# Patient Record
Sex: Female | Born: 1975
Health system: Southern US, Community
[De-identification: ages and names within clinical notes are randomized; demographics above are authoritative.]

## PROBLEM LIST (undated history)

## (undated) DIAGNOSIS — J45909 Unspecified asthma, uncomplicated: Secondary | ICD-10-CM

## (undated) DIAGNOSIS — R51 Headache: Secondary | ICD-10-CM

## (undated) DIAGNOSIS — K219 Gastro-esophageal reflux disease without esophagitis: Secondary | ICD-10-CM

## (undated) DIAGNOSIS — E079 Disorder of thyroid, unspecified: Secondary | ICD-10-CM

## (undated) DIAGNOSIS — D649 Anemia, unspecified: Secondary | ICD-10-CM

## (undated) DIAGNOSIS — Z8739 Personal history of other diseases of the musculoskeletal system and connective tissue: Secondary | ICD-10-CM

## (undated) DIAGNOSIS — K8020 Calculus of gallbladder without cholecystitis without obstruction: Secondary | ICD-10-CM

## (undated) DIAGNOSIS — D699 Hemorrhagic condition, unspecified: Secondary | ICD-10-CM

## (undated) DIAGNOSIS — E119 Type 2 diabetes mellitus without complications: Secondary | ICD-10-CM

## (undated) DIAGNOSIS — I10 Essential (primary) hypertension: Secondary | ICD-10-CM

## (undated) DIAGNOSIS — E669 Obesity, unspecified: Secondary | ICD-10-CM

## (undated) HISTORY — DX: Unspecified asthma, uncomplicated: J45.909

## (undated) HISTORY — DX: Type 2 diabetes mellitus without complications: E11.9

## (undated) HISTORY — DX: Anemia, unspecified: D64.9

## (undated) HISTORY — DX: Gastro-esophageal reflux disease without esophagitis: K21.9

---

## 2012-04-15 ENCOUNTER — Emergency Department (HOSPITAL_COMMUNITY)
Admission: EM | Admit: 2012-04-15 | Discharge: 2012-04-15 | Disposition: A | Discharge status: Home / Self Care | Payer: Self-pay | Attending: Emergency Medicine | Admitting: Emergency Medicine

## 2012-04-15 ENCOUNTER — Encounter (HOSPITAL_COMMUNITY): Payer: Self-pay | Admitting: *Deleted

## 2012-04-15 DIAGNOSIS — S139XXA Sprain of joints and ligaments of unspecified parts of neck, initial encounter: Secondary | ICD-10-CM | POA: Insufficient documentation

## 2012-04-15 DIAGNOSIS — Y929 Unspecified place or not applicable: Secondary | ICD-10-CM | POA: Insufficient documentation

## 2012-04-15 DIAGNOSIS — Z8709 Personal history of other diseases of the respiratory system: Secondary | ICD-10-CM | POA: Insufficient documentation

## 2012-04-15 DIAGNOSIS — X58XXXA Exposure to other specified factors, initial encounter: Secondary | ICD-10-CM | POA: Insufficient documentation

## 2012-04-15 DIAGNOSIS — I10 Essential (primary) hypertension: Secondary | ICD-10-CM | POA: Insufficient documentation

## 2012-04-15 DIAGNOSIS — E079 Disorder of thyroid, unspecified: Secondary | ICD-10-CM | POA: Insufficient documentation

## 2012-04-15 DIAGNOSIS — S161XXA Strain of muscle, fascia and tendon at neck level, initial encounter: Secondary | ICD-10-CM

## 2012-04-15 DIAGNOSIS — Y939 Activity, unspecified: Secondary | ICD-10-CM | POA: Insufficient documentation

## 2012-04-15 HISTORY — DX: Disorder of thyroid, unspecified: E07.9

## 2012-04-15 HISTORY — DX: Essential (primary) hypertension: I10

## 2012-04-15 MED ORDER — IBUPROFEN 800 MG PO TABS: 800.0000 mg | ORAL_TABLET | Freq: Three times a day (TID) | ORAL | Status: DC | PRN

## 2012-04-15 MED ORDER — HYDROCODONE-ACETAMINOPHEN 5-325 MG PO TABS: 1.0000 | ORAL_TABLET | Freq: Four times a day (QID) | ORAL | Status: DC | PRN

## 2012-04-15 NOTE — ED Notes (Signed)
C/O neck pain x 1 month. States Dr. In [facility] told her she had thyroid disease but did not put her on meds.

## 2012-04-15 NOTE — ED Provider Notes (Signed)
Medical screening examination/treatment/procedure(s) were performed by non-physician practitioner and as supervising physician I was immediately available for consultation/collaboration.   Glynn Octave, MD 04/15/12 [phone]

## 2012-04-15 NOTE — ED Notes (Signed)
PT is here for bilateral neck pain with movement and was seen one month ago in [facility] and was given pain medications and now she is out.  Pain hurts with movement

## 2012-04-15 NOTE — ED Provider Notes (Signed)
History     CSN: [id number]  Arrival date & time 04/15/12  1042   First MD Initiated Contact with Patient 04/15/12 1148      Chief Complaint  Patient presents with  . Neck Pain    (Consider location/radiation/quality/duration/timing/severity/associated sxs/prior treatment) HPI Ms. Moustafa is a 37 year old female with cervical neck pain x 1 month. She had similar cervical neck pain 1 year ago. 1 month ago she went to a doctor in [place], and was told she had a muscle spasm and was given some pain medication and muscle relaxant. Her pain was relieved, but has since returned. She states the pain is 8/10, bilateral on the lateral neck, doesn't radiate, and hurts with cervical rotation. She has used heat, which helps, but the pain returns when she removes the heat. She has been taking her flexeril and tramadol, but these have not relieved the pain. She took one of her husband's hydrocodone, which she reports the pain went away and she was able to sleep.  Past Medical History  Diagnosis Date  . Thyroid disease   . Hypertension   . Bronchitis     History reviewed. No pertinent past surgical history.  No family history on file.  History  Substance Use Topics  . Smoking status: Never Smoker   . Smokeless tobacco: Not on file  . Alcohol Use: Yes     Comment: occ    OB History   Grav Para Term Preterm Abortions TAB SAB Ect Mult Living                  Review of Systems All other systems negative except as documented in the HPI. All pertinent positives and negatives as reviewed in the HPI.  Allergies  Review of patient's allergies indicates no known allergies.  Home Medications  No current outpatient prescriptions on file.  BP 164/99  Pulse 94  Temp(Src) 98.1 F (36.7 C) (Oral)  Resp 18  SpO2 97%  Physical Exam  Constitutional: She is oriented to person, place, and time. She appears well-developed and well-nourished.  HENT:  Head: Normocephalic and atraumatic.    Eyes: Pupils are equal, round, and reactive to light.  Neck: Neck supple. Muscular tenderness present. Carotid bruit is not present.    Cardiovascular: Normal rate and regular rhythm.   Pulmonary/Chest: Effort normal and breath sounds normal.  Neurological: She is alert and oriented to person, place, and time. She has normal reflexes. She exhibits normal muscle tone. Coordination normal.  5/5 strength with cervical rotation, flexion and extension.  Skin: Skin is warm and dry. No rash noted.    ED Course  Procedures (including critical care time)  Patient be treated for cervical strain.  Told to return here as needed.  Patient not have any signs or symptoms of neurological deficits or impairment.  Patient has normal reflexes in the upper extremity.  Patient is advised to use ice and heat on her neck.  No acute injury was noted at that time the pain began.  Therefore no x-rays were ordered.   MDM          Carlyle Dolly, PA-C 04/15/12 1228

## 2013-09-01 ENCOUNTER — Ambulatory Visit: Payer: Medicaid Other | Admitting: Obstetrics

## 2013-09-02 ENCOUNTER — Encounter (INDEPENDENT_AMBULATORY_CARE_PROVIDER_SITE_OTHER): Payer: Self-pay | Admitting: Surgery

## 2013-09-02 ENCOUNTER — Ambulatory Visit (INDEPENDENT_AMBULATORY_CARE_PROVIDER_SITE_OTHER): Payer: Medicaid Other | Admitting: Surgery

## 2013-09-02 VITALS — BP 136/88 | HR 74 | Temp 97.4°F | Resp 16 | Ht 62.0 in | Wt 261.4 lb

## 2013-09-02 DIAGNOSIS — K8010 Calculus of gallbladder with chronic cholecystitis without obstruction: Secondary | ICD-10-CM

## 2013-09-02 NOTE — Progress Notes (Signed)
General Surgery [facility]  Chief Complaint  Patient presents with  . New Evaluation    symptomatic cholelithiasis - referral from Dr. Nolene Ebbs    HISTORY: Patient is a 38 year old female referred by her primary care physician for evaluation for cholecystectomy. Patient has a history of cholelithiasis dating back approximately 8 years. She has had numerous moves from location to location which has prevented her from having her gallbladder removed. She has recently been diagnosed with type 2 diabetes.  Patient has right upper quadrant abdominal pain associated with nausea approximately weekly. This appears to be associated with fatty foods. She denies any history of John this or acholic stools. She has had no prior abdominal surgery.  Ultrasound performed 08/05/2013 at [facility] shows multiple gallstones, slightly contracted gallbladder, and a thickened gallbladder wall measuring 4 mm.  Past Medical History  Diagnosis Date  . Thyroid disease   . Hypertension   . Bronchitis   . Anemia   . Asthma   . GERD (gastroesophageal reflux disease)   . Diabetes mellitus without complication     Current Outpatient Prescriptions  Medication Sig Dispense Refill  . albuterol (PROVENTIL HFA;VENTOLIN HFA) 108 (90 BASE) MCG/ACT inhaler Inhale 2 puffs into the lungs every 4 (four) hours as needed for wheezing or shortness of breath.      . Calcium Carbonate Antacid (TUMS E-X PO) Take by mouth.      Marland Kitchen HYDROcodone-acetaminophen (NORCO/VICODIN) 5-325 MG per tablet Take 1 tablet by mouth every 6 (six) hours as needed for pain.  15 tablet  0  . ibuprofen (ADVIL,MOTRIN) 800 MG tablet Take 1 tablet (800 mg total) by mouth every 8 (eight) hours as needed for pain.  21 tablet  0  . IRON, FERROUS GLUCONATE, PO Take by mouth.      Marland Kitchen lisinopril-hydrochlorothiazide (PRINZIDE,ZESTORETIC) 20-12.5 MG per tablet Take 1 tablet by mouth daily.      Marland Kitchen METFORMIN HCL PO Take 500 mg by mouth 2  (two) times daily.       . pantoprazole (PROTONIX) 40 MG tablet Take 40 mg by mouth daily.      . phentermine 37.5 MG capsule Take 37.5 mg by mouth every morning.       No current facility-administered medications for this visit.    No Known Allergies  Family History  Problem Relation Age of Onset  . Cancer Mother     Pancreatic Cancer    History   Social History  . Marital Status: Married    Spouse Name: N/A    Number of Children: N/A  . Years of Education: N/A   Social History Main Topics  . Smoking status: Current Every Day Smoker  . Smokeless tobacco: None  . Alcohol Use: Yes     Comment: occ  . Drug Use: No  . Sexual Activity: None   Other Topics Concern  . None   Social History Narrative  . None    REVIEW OF SYSTEMS - PERTINENT POSITIVES ONLY: Intermittent right upper quadrant abdominal pain. Intermittent nausea. Fatty food intolerance.  EXAM: Filed Vitals:   09/02/13 1538  BP: 136/88  Pulse: 74  Temp: 97.4 F (36.3 C)  Resp: 16    GENERAL: well-developed, well-nourished, no acute distress HEENT: normocephalic; pupils equal and reactive; sclerae clear; dentition good; mucous membranes moist NECK:  No palpable masses in the thyroid bed; symmetric on extension; no palpable anterior or posterior cervical lymphadenopathy; no supraclavicular masses; no tenderness CHEST: clear to  auscultation bilaterally without rales, rhonchi, or wheezes CARDIAC: regular rate and rhythm without significant murmur; peripheral pulses are full ABDOMEN: soft without distension; bowel sounds present; no mass; no hepatosplenomegaly; no hernia EXT:  non-tender without edema; no deformity NEURO: no gross focal deficits; no sign of tremor   LABORATORY RESULTS: See [facility] (CHL-Epic) for most recent results  RADIOLOGY RESULTS: See [facility] (CHL-Epic) for most recent results  IMPRESSION: Symptomatic cholelithiasis, chronic cholecystitis  PLAN: I discussed  the above findings at length with the patient. We discussed laparoscopic cholecystectomy. I provided her with written literature to review at home. Patient would like to proceed with cholecystectomy in the near future.  The risks and benefits of the procedure have been discussed at length with the patient.  The patient understands the proposed procedure, potential alternative treatments, and the course of recovery to be expected.  All of the patient's questions have been answered at this time.  The patient wishes to proceed with surgery.  Earnstine Regal, MD, [facility]  Primary Care Physician: Philis Fendt, MD

## 2013-09-02 NOTE — Patient Instructions (Signed)
  [facility]  LAPAROSCOPIC SURGERY - POST-OP INSTRUCTIONS  Always review your discharge instruction sheet given to you by the facility where your surgery was performed.  A prescription for pain medication may be given to you upon discharge.  Take your pain medication as prescribed.  If narcotic pain medicine is not needed, then you may take acetaminophen (Tylenol) or ibuprofen (Advil) as needed.  Take your usually prescribed medications unless otherwise directed.  If you need a refill on your pain medication, please contact your pharmacy.  They will contact our office to request authorization. Prescriptions will not be filled after 5 P.M. or on weekends.  You should follow a light diet the first few days after arrival home, such as soup and crackers or toast.  Be sure to include plenty of fluids daily.  Most patients will experience some swelling and bruising in the area of the incisions.  Ice packs will help.  Swelling and bruising can take several days to resolve.   It is common to experience some constipation if taking pain medication after surgery.  Increasing fluid intake and taking a stool softener (such as Colace) will usually help or prevent this problem from occurring.  A mild laxative (Milk of Magnesia or Miralax) should be taken according to package instructions if there are no bowel movements after 48 hours.  Unless discharge instructions indicate otherwise, you may remove your bandages 24-48 hours after surgery, and you may shower at that time.  You may have steri-strips (small skin tapes) in place directly over the incision.  These strips should be left on the skin for 7-10 days.  If your surgeon used skin glue on the incision, you may shower in 24 hours.  The glue will flake off over the next 2-3 weeks.  Any sutures or staples will be removed at the office during your follow-up visit.  ACTIVITIES:  You may resume regular (light) daily activities beginning the  next day-such as daily self-care, walking, climbing stairs-gradually increasing activities as tolerated.  You may have sexual intercourse when it is comfortable.  Refrain from any heavy lifting or straining until approved by your doctor.  You may drive when you are no longer taking prescription pain medication, you can comfortably wear a seatbelt, and you can safely maneuver your car and apply brakes.  You should see your doctor in the office for a follow-up appointment approximately 2-3 weeks after your surgery.  Make sure that you call for this appointment within a day or two after you arrive home to insure a convenient appointment time.  WHEN TO CALL YOUR DOCTOR: 1. Fever over 101.0 2. Inability to urinate 3. Continued bleeding from incision 4. Increased pain, redness, or drainage from the incision 5. Increasing abdominal pain  The clinic staff is available to answer your questions during regular business hours.  Please don't hesitate to call and ask to speak to one of the nurses for clinical concerns.  If you have a medical emergency, go to the nearest emergency room or call 911.  A surgeon from [facility] is always on call for the hospital.  Shavaun Osterloh M. Kharon Hixon, MD, FACS [facility] Office: [phone] Toll Free:  [phone] [phone]  Web site: [email or web address] 

## 2013-09-16 ENCOUNTER — Encounter (HOSPITAL_COMMUNITY): Payer: Self-pay | Admitting: Pharmacy Technician

## 2013-09-17 NOTE — Patient Instructions (Addendum)
Crystal Brewer  09/17/2013                           YOUR PROCEDURE IS SCHEDULED ON: 09/26/13               ENTER THRU [facility] MAIN HOSPITAL ENTRANCE AND                            FOLLOW  SIGNS TO SHORT STAY CENTER                 ARRIVE AT SHORT STAY AT: 10:30 AM               CALL THIS NUMBER IF ANY PROBLEMS THE DAY OF SURGERY :               [phone]                                REMEMBER:   Do not eat food or drink liquids AFTER MIDNIGHT            MAY HAVE WATER UNTIL 6:30 AM            STOP ALL ASPIRIN AND HERBAL MEDS 5 DAYS PREOP                  Take these medicines the morning of surgery with               A SIPS OF WATER :    none   Do not wear jewelry, make-up   Do not wear lotions, powders, or perfumes.   Do not shave legs or underarms 12 hrs. before surgery (men may shave face)  Do not bring valuables to the hospital.  Contacts, dentures or bridgework may not be worn into surgery.  Leave suitcase in the car. After surgery it may be brought to your room.  For patients admitted to the hospital more than one night, checkout time is            11:00 AM                                                       The day of discharge.   Patients discharged the day of surgery will not be allowed to drive home.            If going home same day of surgery, must have someone stay with you              FIRST 24 hrs at home and arrange for some one to drive you              home from hospital.   ________________________________________________________________________                                                                        [place]  Before surgery, you can  play an important role.  Because skin is not sterile, your skin needs to be as free of germs as possible.  You can reduce the number of germs on your skin by washing with CHG (chlorahexidine gluconate) soap before surgery.  CHG is an antiseptic cleaner which  kills germs and bonds with the skin to continue killing germs even after washing. Please DO NOT use if you have an allergy to CHG or antibacterial soaps.  If your skin becomes reddened/irritated stop using the CHG and inform your nurse when you arrive at Short Stay. Do not shave (including legs and underarms) for at least 48 hours prior to the first CHG shower.  You may shave your face. Please follow these instructions carefully:   1.  Shower with CHG Soap the night before surgery and the  morning of Surgery.   2.  If you choose to wash your hair, wash your hair first as usual with your  normal  Shampoo.   3.  After you shampoo, rinse your hair and body thoroughly to remove the  shampoo.                                         4.  Use CHG as you would any other liquid soap.  You can apply chg directly  to the skin and wash . Gently wash with scrungie or clean wascloth    5.  Apply the CHG Soap to your body ONLY FROM THE NECK DOWN.   Do not use on open                           Wound or open sores. Avoid contact with eyes, ears mouth and genitals (private parts).                        Genitals (private parts) with your normal soap.              6.  Wash thoroughly, paying special attention to the area where your surgery  will be performed.   7.  Thoroughly rinse your body with warm water from the neck down.   8.  DO NOT shower/wash with your normal soap after using and rinsing off  the CHG Soap .                9.  Pat yourself dry with a clean towel.             10.  Wear clean pajamas.             11.  Place clean sheets on your bed the night of your first shower and do not  sleep with pets.  Day of Surgery : Do not apply any lotions/deodorants the morning of surgery.  Please wear clean clothes to the hospital/surgery center.  FAILURE TO FOLLOW THESE INSTRUCTIONS MAY RESULT IN THE CANCELLATION OF YOUR SURGERY    PATIENT  SIGNATURE_________________________________  ______________________________________________________________________

## 2013-09-18 ENCOUNTER — Ambulatory Visit (HOSPITAL_COMMUNITY)
Admission: RE | Admit: 2013-09-18 | Discharge: 2013-09-18 | Disposition: A | Discharge status: Home / Self Care | Payer: Medicaid Other | Source: Ambulatory Visit | Attending: Anesthesiology | Admitting: Anesthesiology

## 2013-09-18 ENCOUNTER — Encounter (HOSPITAL_COMMUNITY): Payer: Self-pay

## 2013-09-18 ENCOUNTER — Encounter (HOSPITAL_COMMUNITY)
Admission: RE | Admit: 2013-09-18 | Discharge: 2013-09-18 | Disposition: A | Discharge status: Home / Self Care | Payer: Medicaid Other | Source: Ambulatory Visit | Attending: Surgery | Admitting: Surgery

## 2013-09-18 DIAGNOSIS — I10 Essential (primary) hypertension: Secondary | ICD-10-CM | POA: Diagnosis not present

## 2013-09-18 HISTORY — DX: Calculus of gallbladder without cholecystitis without obstruction: K80.20

## 2013-09-18 HISTORY — DX: Headache: R51

## 2013-09-18 HISTORY — DX: Hemorrhagic condition, unspecified: D69.9

## 2013-09-18 LAB — BASIC METABOLIC PANEL
ANION GAP: 12 (ref 5–15)
BUN: 15 mg/dL (ref 6–23)
CALCIUM: 9.7 mg/dL (ref 8.4–10.5)
CHLORIDE: 102 meq/L (ref 96–112)
CO2: 23 meq/L (ref 19–32)
Creatinine, Ser: 0.61 mg/dL (ref 0.50–1.10)
GFR calc non Af Amer: >90 mL/min (ref >90)
Glucose, Bld: 139 mg/dL — ABNORMAL HIGH (ref 70–99)
Potassium: 3.8 mEq/L (ref 3.7–5.3)
SODIUM: 137 meq/L (ref 137–147)

## 2013-09-18 LAB — CBC
HCT: 29.4 % — ABNORMAL LOW (ref 36.0–46.0)
Hemoglobin: 8.7 g/dL — ABNORMAL LOW (ref 12.0–15.0)
MCH: 23.6 pg — AB (ref 26.0–34.0)
MCHC: 29.6 g/dL — ABNORMAL LOW (ref 30.0–36.0)
MCV: 79.9 fL (ref 78.0–100.0)
PLATELETS: 487 10*3/uL — AB (ref 150–400)
RBC: 3.68 MIL/uL — AB (ref 3.87–5.11)
RDW: 17.5 % — AB (ref 11.5–15.5)
WBC: 10.2 10*3/uL (ref 4.0–10.5)

## 2013-09-18 LAB — HCG, SERUM, QUALITATIVE: PREG SERUM: NEGATIVE

## 2013-09-18 NOTE — Progress Notes (Signed)
09/18/13 0825  OBSTRUCTIVE SLEEP APNEA  Have you ever been diagnosed with sleep apnea through a sleep study? No  Do you snore loudly (loud enough to be heard through closed doors)?  1  Do you often feel tired, fatigued, or sleepy during the daytime? 1  Has anyone observed you stop breathing during your sleep? 0  Do you have, or are you being treated for high blood pressure? 1  BMI more than 35 kg/m2? 1  Age over 39 years old? 0  Neck circumference greater than 40 cm/16 inches? 1  Gender: 0  Obstructive Sleep Apnea Score 5  Score 4 or greater  Results sent to PCP

## 2013-09-18 NOTE — Progress Notes (Signed)
Abnormal CBC faxed to Dr, Harlow Asa

## 2013-09-19 NOTE — Progress Notes (Signed)
Quick Note:  These results are acceptable for scheduled surgery.  Sherilee Smotherman M. Marisha Renier, MD, FACS [facility] Office: [phone]   ______ 

## 2013-09-19 NOTE — Progress Notes (Signed)
Quick Note:  Pre-operative chest x-ray is acceptable for scheduled surgery.  Akila Batta M. Benno Brensinger, MD, FACS [facility] Office: [phone]   ______ 

## 2013-09-19 NOTE — Progress Notes (Signed)
Quick Note:  EKG is acceptable for scheduled surgery.  Lars Jeziorski M. Blease Capaldi, MD, FACS [facility] Office: [phone]   ______ 

## 2013-09-26 ENCOUNTER — Encounter (HOSPITAL_COMMUNITY)
Admission: RE | Disposition: A | Discharge status: Home / Self Care | Payer: Self-pay | Source: Ambulatory Visit | Attending: Surgery

## 2013-09-26 ENCOUNTER — Encounter (HOSPITAL_COMMUNITY): Payer: Self-pay | Admitting: *Deleted

## 2013-09-26 ENCOUNTER — Ambulatory Visit (HOSPITAL_COMMUNITY): Payer: Medicaid Other

## 2013-09-26 ENCOUNTER — Telehealth (INDEPENDENT_AMBULATORY_CARE_PROVIDER_SITE_OTHER): Payer: Self-pay

## 2013-09-26 ENCOUNTER — Observation Stay (HOSPITAL_COMMUNITY)
Admission: RE | Admit: 2013-09-26 | Discharge: 2013-09-26 | Disposition: A | Discharge status: Home / Self Care | Payer: Medicaid Other | Source: Ambulatory Visit | Attending: Surgery | Admitting: Surgery

## 2013-09-26 ENCOUNTER — Ambulatory Visit (HOSPITAL_COMMUNITY): Payer: Medicaid Other | Admitting: Anesthesiology

## 2013-09-26 ENCOUNTER — Encounter (HOSPITAL_COMMUNITY): Payer: Medicaid Other | Admitting: Anesthesiology

## 2013-09-26 DIAGNOSIS — J45909 Unspecified asthma, uncomplicated: Secondary | ICD-10-CM | POA: Diagnosis not present

## 2013-09-26 DIAGNOSIS — E079 Disorder of thyroid, unspecified: Secondary | ICD-10-CM | POA: Insufficient documentation

## 2013-09-26 DIAGNOSIS — K219 Gastro-esophageal reflux disease without esophagitis: Secondary | ICD-10-CM | POA: Insufficient documentation

## 2013-09-26 DIAGNOSIS — K8010 Calculus of gallbladder with chronic cholecystitis without obstruction: Secondary | ICD-10-CM | POA: Diagnosis present

## 2013-09-26 DIAGNOSIS — F17200 Nicotine dependence, unspecified, uncomplicated: Secondary | ICD-10-CM | POA: Insufficient documentation

## 2013-09-26 DIAGNOSIS — D649 Anemia, unspecified: Secondary | ICD-10-CM | POA: Diagnosis not present

## 2013-09-26 DIAGNOSIS — E119 Type 2 diabetes mellitus without complications: Secondary | ICD-10-CM | POA: Insufficient documentation

## 2013-09-26 DIAGNOSIS — I10 Essential (primary) hypertension: Secondary | ICD-10-CM | POA: Diagnosis not present

## 2013-09-26 DIAGNOSIS — Z79899 Other long term (current) drug therapy: Secondary | ICD-10-CM | POA: Diagnosis not present

## 2013-09-26 HISTORY — DX: Calculus of gallbladder with chronic cholecystitis without obstruction: K80.10

## 2013-09-26 HISTORY — PX: CHOLECYSTECTOMY: SHX55

## 2013-09-26 LAB — GLUCOSE, CAPILLARY
Glucose-Capillary: 141 mg/dL — ABNORMAL HIGH (ref 70–99)
Glucose-Capillary: 151 mg/dL — ABNORMAL HIGH (ref 70–99)

## 2013-09-26 SURGERY — LAPAROSCOPIC CHOLECYSTECTOMY WITH INTRAOPERATIVE CHOLANGIOGRAM
Anesthesia: General | Site: Abdomen

## 2013-09-26 MED ORDER — HYDROMORPHONE HCL PF 1 MG/ML IJ SOLN: 0.2500 mg | INTRAMUSCULAR | Status: DC | PRN

## 2013-09-26 MED ORDER — HYDROMORPHONE HCL PF 2 MG/ML IJ SOLN
INTRAMUSCULAR | Status: AC
  Filled 2013-09-26: qty 1

## 2013-09-26 MED ORDER — HYDROCHLOROTHIAZIDE 12.5 MG PO CAPS: 12.5000 mg | ORAL_CAPSULE | Freq: Every day | ORAL | Status: DC

## 2013-09-26 MED ORDER — PROPOFOL 10 MG/ML IV BOLUS
INTRAVENOUS | Status: DC | PRN
  Administered 2013-09-26: 170 mg via INTRAVENOUS

## 2013-09-26 MED ORDER — ROCURONIUM BROMIDE 100 MG/10ML IV SOLN
INTRAVENOUS | Status: DC | PRN
  Administered 2013-09-26: 10 mg via INTRAVENOUS
  Administered 2013-09-26: 40 mg via INTRAVENOUS

## 2013-09-26 MED ORDER — LACTATED RINGERS IV SOLN
INTRAVENOUS | Status: DC | PRN
  Administered 2013-09-26: 11:00:00 via INTRAVENOUS

## 2013-09-26 MED ORDER — HYDROCODONE-ACETAMINOPHEN 5-325 MG PO TABS: 1.0000 | ORAL_TABLET | ORAL | Status: DC | PRN

## 2013-09-26 MED ORDER — ALBUTEROL SULFATE (2.5 MG/3ML) 0.083% IN NEBU: 2.5000 mg | INHALATION_SOLUTION | RESPIRATORY_TRACT | Status: DC | PRN

## 2013-09-26 MED ORDER — ONDANSETRON HCL 4 MG/2ML IJ SOLN
INTRAMUSCULAR | Status: DC | PRN
  Administered 2013-09-26: 4 mg via INTRAVENOUS

## 2013-09-26 MED ORDER — LIDOCAINE HCL (CARDIAC) 20 MG/ML IV SOLN
INTRAVENOUS | Status: DC | PRN
  Administered 2013-09-26: 50 mg via INTRAVENOUS

## 2013-09-26 MED ORDER — MIDAZOLAM HCL 2 MG/2ML IJ SOLN
INTRAMUSCULAR | Status: AC
  Filled 2013-09-26: qty 2

## 2013-09-26 MED ORDER — NEOSTIGMINE METHYLSULFATE 10 MG/10ML IV SOLN
INTRAVENOUS | Status: DC | PRN
  Administered 2013-09-26: 4.5 mg via INTRAVENOUS

## 2013-09-26 MED ORDER — FENTANYL CITRATE 0.05 MG/ML IJ SOLN
INTRAMUSCULAR | Status: AC
  Filled 2013-09-26: qty 5

## 2013-09-26 MED ORDER — BUPIVACAINE-EPINEPHRINE (PF) 0.5% -1:200000 IJ SOLN
INTRAMUSCULAR | Status: AC
  Filled 2013-09-26: qty 30

## 2013-09-26 MED ORDER — PROMETHAZINE HCL 25 MG PO TABS: 25.0000 mg | ORAL_TABLET | Freq: Four times a day (QID) | ORAL | Status: DC | PRN

## 2013-09-26 MED ORDER — CEFAZOLIN SODIUM-DEXTROSE 2-3 GM-% IV SOLR
INTRAVENOUS | Status: AC
  Filled 2013-09-26: qty 50

## 2013-09-26 MED ORDER — HYDROMORPHONE HCL PF 1 MG/ML IJ SOLN
INTRAMUSCULAR | Status: DC | PRN
  Administered 2013-09-26 (×2): 0.5 mg via INTRAVENOUS

## 2013-09-26 MED ORDER — IOHEXOL 300 MG/ML  SOLN
INTRAMUSCULAR | Status: DC | PRN
  Administered 2013-09-26: 15 mL via INTRAVENOUS

## 2013-09-26 MED ORDER — OXYCODONE HCL 5 MG/5ML PO SOLN
5.0000 mg | Freq: Once | ORAL | Status: DC | PRN
  Filled 2013-09-26: qty 5

## 2013-09-26 MED ORDER — MEPERIDINE HCL 50 MG/ML IJ SOLN: 6.2500 mg | INTRAMUSCULAR | Status: DC | PRN

## 2013-09-26 MED ORDER — OXYCODONE HCL 5 MG PO TABS: 5.0000 mg | ORAL_TABLET | Freq: Once | ORAL | Status: DC | PRN

## 2013-09-26 MED ORDER — ACETAMINOPHEN 325 MG PO TABS
650.0000 mg | ORAL_TABLET | ORAL | Status: DC | PRN
Start: 2013-09-26 — End: 2013-09-26

## 2013-09-26 MED ORDER — PROMETHAZINE HCL 25 MG/ML IJ SOLN: 6.2500 mg | INTRAMUSCULAR | Status: DC | PRN

## 2013-09-26 MED ORDER — LISINOPRIL 20 MG PO TABS: 20.0000 mg | ORAL_TABLET | Freq: Every day | ORAL | Status: DC

## 2013-09-26 MED ORDER — PROPOFOL 10 MG/ML IV BOLUS
INTRAVENOUS | Status: AC
  Filled 2013-09-26: qty 20

## 2013-09-26 MED ORDER — MIDAZOLAM HCL 5 MG/5ML IJ SOLN
INTRAMUSCULAR | Status: DC | PRN
  Administered 2013-09-26: 2 mg via INTRAVENOUS

## 2013-09-26 MED ORDER — CEFAZOLIN SODIUM-DEXTROSE 2-3 GM-% IV SOLR
2.0000 g | INTRAVENOUS | Status: AC
  Administered 2013-09-26: 2 g via INTRAVENOUS

## 2013-09-26 MED ORDER — LISINOPRIL-HYDROCHLOROTHIAZIDE 20-12.5 MG PO TABS: 1.0000 | ORAL_TABLET | Freq: Every morning | ORAL | Status: DC

## 2013-09-26 MED ORDER — KCL IN DEXTROSE-NACL 30-5-0.45 MEQ/L-%-% IV SOLN
INTRAVENOUS | Status: DC
  Administered 2013-09-26: 16:00:00 via INTRAVENOUS
  Filled 2013-09-26: qty 1000

## 2013-09-26 MED ORDER — FENTANYL CITRATE 0.05 MG/ML IJ SOLN
INTRAMUSCULAR | Status: DC | PRN
  Administered 2013-09-26 (×3): 50 ug via INTRAVENOUS
  Administered 2013-09-26: 100 ug via INTRAVENOUS

## 2013-09-26 MED ORDER — METFORMIN HCL 500 MG PO TABS
500.0000 mg | ORAL_TABLET | Freq: Two times a day (BID) | ORAL | Status: DC
  Filled 2013-09-26: qty 1

## 2013-09-26 MED ORDER — GLYCOPYRROLATE 0.2 MG/ML IJ SOLN
INTRAMUSCULAR | Status: DC | PRN
  Administered 2013-09-26: .8 mg via INTRAVENOUS

## 2013-09-26 MED ORDER — LACTATED RINGERS IV SOLN: INTRAVENOUS | Status: DC

## 2013-09-26 MED ORDER — LACTATED RINGERS IR SOLN
Status: DC | PRN
  Administered 2013-09-26: 1000 mL

## 2013-09-26 MED ORDER — HYDROMORPHONE HCL PF 1 MG/ML IJ SOLN
1.0000 mg | INTRAMUSCULAR | Status: DC | PRN
  Administered 2013-09-26: 1 mg via INTRAVENOUS
  Filled 2013-09-26: qty 1

## 2013-09-26 MED ORDER — BUPIVACAINE-EPINEPHRINE 0.5% -1:200000 IJ SOLN
INTRAMUSCULAR | Status: DC | PRN
  Administered 2013-09-26: 20 mL

## 2013-09-26 SURGICAL SUPPLY — 34 items
APPLIER CLIP ROT 10 11.4 M/L (STAPLE) ×3
BENZOIN TINCTURE PRP APPL 2/3 (GAUZE/BANDAGES/DRESSINGS) ×3 IMPLANT
CABLE HIGH FREQUENCY MONO STRZ (ELECTRODE) ×3 IMPLANT
CANISTER SUCTION 2500CC (MISCELLANEOUS) ×3 IMPLANT
CHLORAPREP W/TINT 26ML (MISCELLANEOUS) ×3 IMPLANT
CLIP APPLIE ROT 10 11.4 M/L (STAPLE) ×1 IMPLANT
CLOSURE WOUND 1/2 X4 (GAUZE/BANDAGES/DRESSINGS) ×1
COVER MAYO STAND STRL (DRAPES) ×3 IMPLANT
DECANTER SPIKE VIAL GLASS SM (MISCELLANEOUS) ×3 IMPLANT
DRAPE C-ARM 42X120 X-RAY (DRAPES) ×3 IMPLANT
DRAPE LAPAROSCOPIC ABDOMINAL (DRAPES) ×3 IMPLANT
DRAPE UTILITY XL STRL (DRAPES) ×3 IMPLANT
ELECT REM PT RETURN 9FT ADLT (ELECTROSURGICAL) ×3
ELECTRODE REM PT RTRN 9FT ADLT (ELECTROSURGICAL) ×1 IMPLANT
GLOVE SURG ORTHO 8.0 STRL STRW (GLOVE) ×3 IMPLANT
GOWN STRL REUS W/TWL XL LVL3 (GOWN DISPOSABLE) ×6 IMPLANT
HEMOSTAT SURGICEL 4X8 (HEMOSTASIS) IMPLANT
KIT BASIN OR (CUSTOM PROCEDURE TRAY) ×3 IMPLANT
NS IRRIG 1000ML POUR BTL (IV SOLUTION) ×3 IMPLANT
POUCH SPECIMEN RETRIEVAL 10MM (ENDOMECHANICALS) ×3 IMPLANT
SCISSORS LAP 5X35 DISP (ENDOMECHANICALS) ×3 IMPLANT
SET CHOLANGIOGRAPH MIX (MISCELLANEOUS) ×3 IMPLANT
SET IRRIG TUBING LAPAROSCOPIC (IRRIGATION / IRRIGATOR) ×3 IMPLANT
SLEEVE XCEL OPT CAN 5 100 (ENDOMECHANICALS) ×3 IMPLANT
SOLUTION ANTI FOG 6CC (MISCELLANEOUS) ×3 IMPLANT
STRIP CLOSURE SKIN 1/2X4 (GAUZE/BANDAGES/DRESSINGS) ×2 IMPLANT
SUT MNCRL AB 4-0 PS2 18 (SUTURE) ×3 IMPLANT
TOWEL OR 17X26 10 PK STRL BLUE (TOWEL DISPOSABLE) ×3 IMPLANT
TOWEL OR NON WOVEN STRL DISP B (DISPOSABLE) ×3 IMPLANT
TRAY LAP CHOLE (CUSTOM PROCEDURE TRAY) ×3 IMPLANT
TROCAR BLADELESS OPT 5 100 (ENDOMECHANICALS) ×3 IMPLANT
TROCAR XCEL BLUNT TIP 100MML (ENDOMECHANICALS) ×3 IMPLANT
TROCAR XCEL NON-BLD 11X100MML (ENDOMECHANICALS) ×3 IMPLANT
TUBING INSUFFLATION 10FT LAP (TUBING) ×3 IMPLANT

## 2013-09-26 NOTE — Op Note (Signed)
Procedure Note  Pre-operative Diagnosis:  Chronic cholecystitis, cholelithiasis  Post-operative Diagnosis:  same  Surgeon:  Earnstine Regal, MD, FACS  Assistant:  none   Procedure:  Laparoscopic cholecystectomy with intra-operative cholangiography  Anesthesia:  General  Estimated Blood Loss:  minimal  Drains: none         Specimen: Gallbladder to pathology  Indications:  38 yo BF with 8 year history of biliary colic, cholelithiasis.  Procedure Details:  The patient was seen in the pre-op holding area. The risks, benefits, complications, treatment options, and expected outcomes were previously discussed with the patient. The patient agreed with the proposed plan and has signed the informed consent form.  The patient was brought to the Operating Room, identified as [facility] and the procedure verified as laparoscopic cholecystectomy with intraoperative cholangiography. A "time out" was completed and the above information confirmed.  The patient was placed in the supine position. Following induction of general anesthesia, the abdomen was prepped and draped in the usual aseptic fashion.  An incision was made in the skin near the umbilicus. The midline fascia was incised and the peritoneal cavity was entered and a Hasson canula was introduced under direct vision.  The Hasson canula was secured with a 0-Vicryl pursestring suture. Pneumoperitoneum was established with carbon dioxide. Additional trocars were introduced under direct vision along the right costal margin in the midline, mid-clavicular line, and anterior axillary line.   The gallbladder was identified and the fundus grasped and retracted cephalad. Adhesions were taken down bluntly and the electrocautery was utilized as needed, taking care not to injure any adjacent structures. The infundibulum was grasped and retracted laterally, exposing the peritoneum overlying the triangle of Calot. The peritoneum was incised and structures  exposed with blunt dissection. The cystic duct was clearly identified, bluntly dissected circumferentially, and clipped at the neck of the gallbladder.  An incision was made in the cystic duct and the cholangiogram catheter introduced. The catheter was secured using an ligaclip.  Real-time cholangiography was performed using C-arm fluoroscopy.  There was rapid filling of a normal caliber common bile duct.  There was reflux of contrast into the left and right hepatic ductal systems.  There was free flow distally into the duodenum without filling defect or obstruction.  The catheter was removed from the peritoneal cavity.  The cystic duct was then ligated with surgical clips and divided. The cystic artery was identified, dissected circumferentially, ligated with ligaclips, and divided.  The gallbladder was dissected away from the liver bed using the electrocautery for hemostasis. The gallbladder was completely removed from the liver and placed into an endocatch bag. The gallbladder was removed in the endocatch bag through the umbilical port site and submitted to pathology for review.  The right upper quadrant was irrigated and the gallbladder bed was inspected. Hemostasis was achieved with the electrocautery.  Pneumoperitoneum was released after viewing removal of the trocars with good hemostasis noted. The umbilical wound was irrigated and the fascia was then closed with the pursestring suture.  Local anesthetic was infiltrated at all port sites. The skin incisions were closed with 4-0 Monocril subcuticular sutures and steri-strips and dressings were applied.  Instrument, sponge, and needle counts were correct at the conclusion of the case.  The patient was awakened from anesthesia and brought to the recovery room in stable condition.  The patient tolerated the procedure well.   Earnstine Regal, MD, [facility] Office: [phone]

## 2013-09-26 NOTE — Anesthesia Postprocedure Evaluation (Signed)
Anesthesia Post Note  Patient: Crystal Brewer  Procedure(s) Performed: Procedure(s) (LRB): LAPAROSCOPIC CHOLECYSTECTOMY WITH INTRAOPERATIVE CHOLANGIOGRAM (N/A)  Anesthesia type: General  Patient location: PACU  Post pain: Pain level controlled  Post assessment: Post-op Vital signs reviewed  Last Vitals: BP 133/85  Pulse 67  Temp(Src) 36.4 C (Oral)  Resp 18  Ht 5\' 2"  (1.575 m)  Wt 260 lb (117.935 kg)  BMI 47.54 kg/m2  SpO2 94%  Post vital signs: Reviewed  Level of consciousness: sedated  Complications: No apparent anesthesia complications \

## 2013-09-26 NOTE — Interval H&P Note (Signed)
History and Physical Interval Note:  09/26/2013 11:27 AM  Crystal Brewer  has presented today for surgery, with the diagnosis of Chronic Choleystitis.  The various methods of treatment have been discussed with the patient and family. After consideration of risks, benefits and other options for treatment, the patient has consented to    Procedure(s): LAPAROSCOPIC CHOLECYSTECTOMY WITH INTRAOPERATIVE CHOLANGIOGRAM (N/A) as a surgical intervention .    The patient's history has been reviewed, patient examined, no change in status, stable for surgery.  I have reviewed the patient's chart and labs.  Questions were answered to the patient's satisfaction.    Earnstine Regal, MD, [facility] Office: [place]

## 2013-09-26 NOTE — Progress Notes (Signed)
Nurse reviewed discharge instructions with pt and husband.  Pt verbalized understanding of discharge instructions, follow up appointments and new medications.  No concerns at time of discharge.

## 2013-09-26 NOTE — Discharge Summary (Signed)
  Physician Discharge Summary [facility]  Patient ID: Crystal Brewer MRN: [id number] DOB/AGE: Jun 15, 1975 38 y.o.  Admit date: 09/26/2013 Discharge date: 09/26/2013  Admission Diagnoses:  Chronic cholecystitis, cholelithiasis  Discharge Diagnoses:  Principal Problem:   Cholelithiasis with cholecystitis Active Problems:   Cholecystitis with cholelithiasis   Discharged Condition: good  Hospital Course: patient admitted for observation after lap chole.  Ambulatory.  Tolerated liquid diet.  Patient insists on discharge home this evening.  Discharged with Rx for Norco and Phenergan tab.  Consults: None  Treatments: surgery: lap chole with IOC  Discharge Exam: Blood pressure 119/72, pulse 61, temperature 98 F (36.7 C), temperature source Oral, resp. rate 16, height 5\' 2"  (1.575 m), weight 260 lb (117.935 kg), SpO2 99.00%. See PACU notes and admit note.  Disposition: Home  Discharge Instructions   Diet - low sodium heart healthy    Complete by:  As directed      Increase activity slowly    Complete by:  As directed             Medication List         albuterol 108 (90 BASE) MCG/ACT inhaler  Commonly known as:  PROVENTIL HFA;VENTOLIN HFA  Inhale 2 puffs into the lungs every 4 (four) hours as needed for wheezing or shortness of breath.     calcium carbonate 500 MG chewable tablet  Commonly known as:  TUMS - dosed in mg elemental calcium  Chew 1 tablet by mouth daily.     FUSION 65-65-25-30 MG Caps  Take 1 tablet by mouth daily.     HYDROcodone-acetaminophen 5-325 MG per tablet  Commonly known as:  NORCO/VICODIN  Take 1-2 tablets by mouth every 4 (four) hours as needed for moderate pain.     lisinopril-hydrochlorothiazide 20-12.5 MG per tablet  Commonly known as:  PRINZIDE,ZESTORETIC  Take 1 tablet by mouth every morning.     metFORMIN 500 MG tablet  Commonly known as:  GLUCOPHAGE  Take 500 mg by mouth 2 (two) times daily with a meal.     promethazine 25 MG tablet  Commonly known as:  PHENERGAN  Take 1 tablet (25 mg total) by mouth every 6 (six) hours as needed for nausea or vomiting.           Follow-up Information   Follow up with Earnstine Regal, MD. Schedule an appointment as soon as possible for a visit in 4 weeks. (For wound re-check)    Specialty:  General Surgery   Contact information:   [address] [place] [address] [phone]       Earnstine Regal, MD, [facility] Office: [phone]   Signed: Earnstine Regal 09/26/2013, 4:50 PM

## 2013-09-26 NOTE — Discharge Instructions (Signed)
  [facility]  LAPAROSCOPIC SURGERY - POST-OP INSTRUCTIONS  Always review your discharge instruction sheet given to you by the facility where your surgery was performed.  A prescription for pain medication may be given to you upon discharge.  Take your pain medication as prescribed.  If narcotic pain medicine is not needed, then you may take acetaminophen (Tylenol) or ibuprofen (Advil) as needed.  Take your usually prescribed medications unless otherwise directed.  If you need a refill on your pain medication, please contact your pharmacy.  They will contact our office to request authorization. Prescriptions will not be filled after 5 P.M. or on weekends.  You should follow a light diet the first few days after arrival home, such as soup and crackers or toast.  Be sure to include plenty of fluids daily.  Most patients will experience some swelling and bruising in the area of the incisions.  Ice packs will help.  Swelling and bruising can take several days to resolve.   It is common to experience some constipation if taking pain medication after surgery.  Increasing fluid intake and taking a stool softener (such as Colace) will usually help or prevent this problem from occurring.  A mild laxative (Milk of Magnesia or Miralax) should be taken according to package instructions if there are no bowel movements after 48 hours.  Unless discharge instructions indicate otherwise, you may remove your bandages 24-48 hours after surgery, and you may shower at that time.  You may have steri-strips (small skin tapes) in place directly over the incision.  These strips should be left on the skin for 7-10 days.  If your surgeon used skin glue on the incision, you may shower in 24 hours.  The glue will flake off over the next 2-3 weeks.  Any sutures or staples will be removed at the office during your follow-up visit.  ACTIVITIES:  You may resume regular (light) daily activities beginning the  next day-such as daily self-care, walking, climbing stairs-gradually increasing activities as tolerated.  You may have sexual intercourse when it is comfortable.  Refrain from any heavy lifting or straining until approved by your doctor.  You may drive when you are no longer taking prescription pain medication, you can comfortably wear a seatbelt, and you can safely maneuver your car and apply brakes.  You should see your doctor in the office for a follow-up appointment approximately 2-3 weeks after your surgery.  Make sure that you call for this appointment within a day or two after you arrive home to insure a convenient appointment time.  WHEN TO CALL YOUR DOCTOR: 1. Fever over 101.0 2. Inability to urinate 3. Continued bleeding from incision 4. Increased pain, redness, or drainage from the incision 5. Increasing abdominal pain  The clinic staff is available to answer your questions during regular business hours.  Please don't hesitate to call and ask to speak to one of the nurses for clinical concerns.  If you have a medical emergency, go to the nearest emergency room or call 911.  A surgeon from [facility] is always on call for the hospital.  Crystal Ashraf M. Perlie Scheuring, MD, FACS [facility] Office: [phone] Toll Free:  [phone] FAX [phone]  Web site: [email or web address] 

## 2013-09-26 NOTE — Transfer of Care (Signed)
Immediate Anesthesia Transfer of Care Note  Patient: Crystal Brewer  Procedure(s) Performed: Procedure(s) (LRB): LAPAROSCOPIC CHOLECYSTECTOMY WITH INTRAOPERATIVE CHOLANGIOGRAM (N/A)  Patient Location: PACU  Anesthesia Type: General  Level of Consciousness: sedated, patient cooperative and responds to stimulation  Airway & Oxygen Therapy: Patient Spontanous Breathing and Patient connected to face mask oxgen  Post-op Assessment: Report given to PACU RN and Post -op Vital signs reviewed and stable  Post vital signs: Reviewed and stable  Complications: No apparent anesthesia complications

## 2013-09-26 NOTE — H&P (View-Only) (Signed)
General Surgery [facility]  Chief Complaint  Patient presents with  . New Evaluation    symptomatic cholelithiasis - referral from Dr. Nolene Ebbs    HISTORY: Patient is a 38 year old female referred by her primary care physician for evaluation for cholecystectomy. Patient has a history of cholelithiasis dating back approximately 8 years. She has had numerous moves from location to location which has prevented her from having her gallbladder removed. She has recently been diagnosed with type 2 diabetes.  Patient has right upper quadrant abdominal pain associated with nausea approximately weekly. This appears to be associated with fatty foods. She denies any history of John this or acholic stools. She has had no prior abdominal surgery.  Ultrasound performed 08/05/2013 at [facility] shows multiple gallstones, slightly contracted gallbladder, and a thickened gallbladder wall measuring 4 mm.  Past Medical History  Diagnosis Date  . Thyroid disease   . Hypertension   . Bronchitis   . Anemia   . Asthma   . GERD (gastroesophageal reflux disease)   . Diabetes mellitus without complication     Current Outpatient Prescriptions  Medication Sig Dispense Refill  . albuterol (PROVENTIL HFA;VENTOLIN HFA) 108 (90 BASE) MCG/ACT inhaler Inhale 2 puffs into the lungs every 4 (four) hours as needed for wheezing or shortness of breath.      . Calcium Carbonate Antacid (TUMS E-X PO) Take by mouth.      Marland Kitchen HYDROcodone-acetaminophen (NORCO/VICODIN) 5-325 MG per tablet Take 1 tablet by mouth every 6 (six) hours as needed for pain.  15 tablet  0  . ibuprofen (ADVIL,MOTRIN) 800 MG tablet Take 1 tablet (800 mg total) by mouth every 8 (eight) hours as needed for pain.  21 tablet  0  . IRON, FERROUS GLUCONATE, PO Take by mouth.      Marland Kitchen lisinopril-hydrochlorothiazide (PRINZIDE,ZESTORETIC) 20-12.5 MG per tablet Take 1 tablet by mouth daily.      Marland Kitchen METFORMIN HCL PO Take 500 mg by mouth 2  (two) times daily.       . pantoprazole (PROTONIX) 40 MG tablet Take 40 mg by mouth daily.      . phentermine 37.5 MG capsule Take 37.5 mg by mouth every morning.       No current facility-administered medications for this visit.    No Known Allergies  Family History  Problem Relation Age of Onset  . Cancer Mother     Pancreatic Cancer    History   Social History  . Marital Status: Married    Spouse Name: N/A    Number of Children: N/A  . Years of Education: N/A   Social History Main Topics  . Smoking status: Current Every Day Smoker  . Smokeless tobacco: None  . Alcohol Use: Yes     Comment: occ  . Drug Use: No  . Sexual Activity: None   Other Topics Concern  . None   Social History Narrative  . None    REVIEW OF SYSTEMS - PERTINENT POSITIVES ONLY: Intermittent right upper quadrant abdominal pain. Intermittent nausea. Fatty food intolerance.  EXAM: Filed Vitals:   09/02/13 1538  BP: 136/88  Pulse: 74  Temp: 97.4 F (36.3 C)  Resp: 16    GENERAL: well-developed, well-nourished, no acute distress HEENT: normocephalic; pupils equal and reactive; sclerae clear; dentition good; mucous membranes moist NECK:  No palpable masses in the thyroid bed; symmetric on extension; no palpable anterior or posterior cervical lymphadenopathy; no supraclavicular masses; no tenderness CHEST: clear to  auscultation bilaterally without rales, rhonchi, or wheezes CARDIAC: regular rate and rhythm without significant murmur; peripheral pulses are full ABDOMEN: soft without distension; bowel sounds present; no mass; no hepatosplenomegaly; no hernia EXT:  non-tender without edema; no deformity NEURO: no gross focal deficits; no sign of tremor   LABORATORY RESULTS: See [facility] (CHL-Epic) for most recent results  RADIOLOGY RESULTS: See [facility] (CHL-Epic) for most recent results  IMPRESSION: Symptomatic cholelithiasis, chronic cholecystitis  PLAN: I discussed  the above findings at length with the patient. We discussed laparoscopic cholecystectomy. I provided her with written literature to review at home. Patient would like to proceed with cholecystectomy in the near future.  The risks and benefits of the procedure have been discussed at length with the patient.  The patient understands the proposed procedure, potential alternative treatments, and the course of recovery to be expected.  All of the patient's questions have been answered at this time.  The patient wishes to proceed with surgery.  Earnstine Regal, MD, [facility]  Primary Care Physician: Philis Fendt, MD

## 2013-09-26 NOTE — Anesthesia Preprocedure Evaluation (Signed)
Anesthesia Evaluation  Patient identified by MRN, date of birth, ID band Patient awake    Reviewed: Allergy & Precautions, H&P , NPO status , Patient's Chart, lab work & pertinent test results  Airway Mallampati: II TM Distance: >3 FB Neck ROM: Full    Dental no notable dental hx.    Pulmonary asthma , Current Smoker,  breath sounds clear to auscultation  Pulmonary exam normal       Cardiovascular hypertension, Pt. on medications Rhythm:Regular Rate:Normal     Neuro/Psych  Headaches, negative psych ROS   GI/Hepatic Neg liver ROS, GERD-  ,  Endo/Other  diabetes, Type 2, Oral Hypoglycemic AgentsMorbid obesity  Renal/GU negative Renal ROS  negative genitourinary   Musculoskeletal negative musculoskeletal ROS (+)   Abdominal   Peds negative pediatric ROS (+)  Hematology  (+) Blood dyscrasia, anemia ,   Anesthesia Other Findings   Reproductive/Obstetrics negative OB ROS                           Anesthesia Physical Anesthesia Plan  ASA: III  Anesthesia Plan: General   Post-op Pain Management:    Induction: Intravenous  Airway Management Planned: Oral ETT  Additional Equipment:   Intra-op Plan:   Post-operative Plan: Extubation in OR  Informed Consent: I have reviewed the patients History and Physical, chart, labs and discussed the procedure including the risks, benefits and alternatives for the proposed anesthesia with the patient or authorized representative who has indicated his/her understanding and acceptance.   Dental advisory given  Plan Discussed with: CRNA  Anesthesia Plan Comments:         Anesthesia Quick Evaluation

## 2013-09-26 NOTE — Telephone Encounter (Signed)
LMOM with date of appt. 

## 2013-09-29 ENCOUNTER — Encounter (HOSPITAL_COMMUNITY): Payer: Self-pay | Admitting: Surgery

## 2013-09-29 ENCOUNTER — Telehealth (INDEPENDENT_AMBULATORY_CARE_PROVIDER_SITE_OTHER): Payer: Self-pay

## 2013-09-29 NOTE — Telephone Encounter (Signed)
Pt home doing well. PO appt made. 

## 2013-10-22 ENCOUNTER — Ambulatory Visit: Payer: Medicaid Other | Admitting: Obstetrics

## 2013-10-29 ENCOUNTER — Encounter (INDEPENDENT_AMBULATORY_CARE_PROVIDER_SITE_OTHER): Payer: Medicaid Other | Admitting: Surgery

## 2014-03-09 ENCOUNTER — Ambulatory Visit (HOSPITAL_BASED_OUTPATIENT_CLINIC_OR_DEPARTMENT_OTHER): Payer: Medicaid Other | Attending: Internal Medicine | Admitting: Radiology

## 2014-03-09 VITALS — Ht 61.0 in | Wt 257.0 lb

## 2014-03-09 DIAGNOSIS — G4733 Obstructive sleep apnea (adult) (pediatric): Secondary | ICD-10-CM | POA: Diagnosis not present

## 2014-03-14 ENCOUNTER — Ambulatory Visit (HOSPITAL_BASED_OUTPATIENT_CLINIC_OR_DEPARTMENT_OTHER): Payer: Medicaid Other | Admitting: Internal Medicine

## 2014-03-14 DIAGNOSIS — G4733 Obstructive sleep apnea (adult) (pediatric): Secondary | ICD-10-CM

## 2014-03-14 NOTE — Sleep Study (Signed)
   NAME: Crystal Brewer DATE OF BIRTH:  Aug 13, 1975 MEDICAL RECORD NUMBER [id number]  LOCATION: [facility]  PHYSICIAN: YOUNG,CLINTON D  DATE OF STUDY: 03/09/2014  SLEEP STUDY TYPE: Nocturnal Polysomnogram               REFERRING PHYSICIAN: Philis Fendt, MD  INDICATION FOR STUDY: Hypersomnia with sleep apnea  EPWORTH SLEEPINESS SCORE:   14/24 HEIGHT: 5\' 1"  (154.9 cm)  WEIGHT: 257 lb (116.574 kg)    Body mass index is 48.58 kg/(m^2).  NECK SIZE: 15 in.  MEDICATIONS: Charted for review  SLEEP ARCHITECTURE: Total sleep time 338 minutes with sleep efficiency 91.1%. Stage I was 15.2%, stage II 70%, stage III absent, REM 14.8% of total sleep time. Sleep latency 9 minutes, REM latency 97 minutes, awake after sleep onset 24.5 minutes, arousal index 27.7, bedtime medication: Metformin  RESPIRATORY DATA: Apnea hypopnea index (AHI) 2.7 per hour. 15 total events scored including 2 obstructive apneas and 13 hypopneas. All events were while nonsupine. REM AHI 15.6 per hour. There were not enough events to permit CPAP titration.  OXYGEN DATA: Mild to moderately loud snoring with oxygen desaturation to a nadir of 80% and mean saturation 93% on room air  CARDIAC DATA: Normal sinus rhythm  MOVEMENT/PARASOMNIA: A few incidental limb jerks were noted with little effect on sleep. Several episodes of bruxism  IMPRESSION/ RECOMMENDATION:   1) Occasional respiratory event with sleep disturbance, within normal limits. AHI 2.7 per hour. The normal range for adults is an AHI from 0-5 events per hour. Mild to moderately loud snoring with oxygen desaturation to a nadir of 80% and mean saturation 93% on room air 2) Sleep pattern was more fragmented unusual by brief spontaneous awakenings unrelated to either respiratory events or bruxism. A trial of management as insomnia might prove helpful. 3) Bruxism was noted.   Deneise Lever Diplomate, American Board of Sleep  Medicine  ELECTRONICALLY SIGNED ON:  03/14/2014, 11:52 AM [place] PH: [phone]   FX: [phone] [place]

## 2014-03-30 ENCOUNTER — Emergency Department (HOSPITAL_COMMUNITY)
Admission: EM | Admit: 2014-03-30 | Discharge: 2014-03-30 | Disposition: A | Discharge status: Home / Self Care | Payer: Medicaid Other | Attending: Emergency Medicine | Admitting: Emergency Medicine

## 2014-03-30 ENCOUNTER — Encounter (HOSPITAL_COMMUNITY): Payer: Self-pay | Admitting: Emergency Medicine

## 2014-03-30 DIAGNOSIS — Z720 Tobacco use: Secondary | ICD-10-CM | POA: Diagnosis not present

## 2014-03-30 DIAGNOSIS — M545 Low back pain, unspecified: Secondary | ICD-10-CM

## 2014-03-30 DIAGNOSIS — D649 Anemia, unspecified: Secondary | ICD-10-CM | POA: Diagnosis not present

## 2014-03-30 DIAGNOSIS — J45909 Unspecified asthma, uncomplicated: Secondary | ICD-10-CM | POA: Insufficient documentation

## 2014-03-30 DIAGNOSIS — Z8719 Personal history of other diseases of the digestive system: Secondary | ICD-10-CM | POA: Diagnosis not present

## 2014-03-30 DIAGNOSIS — Z79899 Other long term (current) drug therapy: Secondary | ICD-10-CM | POA: Insufficient documentation

## 2014-03-30 DIAGNOSIS — Z8639 Personal history of other endocrine, nutritional and metabolic disease: Secondary | ICD-10-CM | POA: Insufficient documentation

## 2014-03-30 DIAGNOSIS — I10 Essential (primary) hypertension: Secondary | ICD-10-CM | POA: Insufficient documentation

## 2014-03-30 DIAGNOSIS — M25531 Pain in right wrist: Secondary | ICD-10-CM | POA: Diagnosis not present

## 2014-03-30 DIAGNOSIS — E119 Type 2 diabetes mellitus without complications: Secondary | ICD-10-CM | POA: Insufficient documentation

## 2014-03-30 MED ORDER — KETOROLAC TROMETHAMINE 60 MG/2ML IM SOLN
60.0000 mg | Freq: Once | INTRAMUSCULAR | Status: AC
  Administered 2014-03-30: 60 mg via INTRAMUSCULAR
  Filled 2014-03-30: qty 2

## 2014-03-30 MED ORDER — NAPROXEN 500 MG PO TABS: 500.0000 mg | ORAL_TABLET | Freq: Two times a day (BID) | ORAL | Status: DC

## 2014-03-30 NOTE — ED Provider Notes (Signed)
CSN: [id number]     Arrival date & time 03/30/14  1020 History  This chart was scribed for non-physician practitioner Comer Locket, working with No att. providers found by Donato Schultz, ED Scribe. This patient was seen in room TR06C/TR06C and the patient's care was started at 10:57 AM.   Chief Complaint  Patient presents with  . Wrist Pain  . Back Pain   Patient is a 39 y.o. female presenting with wrist pain and back pain. The history is provided by the patient. No language interpreter was used.  Wrist Pain Pertinent negatives include no abdominal pain.  Back Pain Associated symptoms: no abdominal pain, no fever, no numbness and no weakness    HPI Comments: Eshani Dworkin is a 39 y.o. female who presents to the Emergency Department complaining of constant right wrist pain with associated swelling that started 1 week ago after she tried to open a jar.  Twisting her wrist aggravates the pain.  Characterizes her discomfort as a soreness. Rates her discomfort as a 8/10 She has been taking Motrin with no relief to her symptoms.  She is right hand dominant.  She does not work but does a lot of housework.  She denies fever, numbness, and weakness as associated symptoms.  She is also complaining of constant lower back pain that started a month ago.  She has seen her PCP for her symptoms and is awaiting to get results back from a UA.  She denies numbness and tingling in her legs and bowel and bladder incontinence as associated symptoms.  She does not have a history of cancer, IV drug use, or steroid use.  Her mother had a history of pancreatic cancer.  Her PCP is Dr. Jeanie Cooks.    Past Medical History  Diagnosis Date  . Thyroid disease   . Hypertension   . Anemia   . Asthma   . GERD (gastroesophageal reflux disease)   . Diabetes mellitus without complication   . Headache(784.0)   . Gallstones   . Bleeding disorder     "UNKNOWN PROBLEM" has not seen "blood doctor "yet   Past Surgical History   Procedure Laterality Date  . Cholecystectomy N/A 09/26/2013    Procedure: LAPAROSCOPIC CHOLECYSTECTOMY WITH INTRAOPERATIVE CHOLANGIOGRAM;  Surgeon: Earnstine Regal, MD;  Location: [facility];  Service: General;  Laterality: N/A;   Family History  Problem Relation Age of Onset  . Cancer Mother     Pancreatic Cancer   History  Substance Use Topics  . Smoking status: Current Every Day Smoker  . Smokeless tobacco: Not on file  . Alcohol Use: No     Comment: occ   OB History    No data available     Review of Systems  Constitutional: Negative for fever.  Gastrointestinal: Negative for abdominal pain.  Musculoskeletal: Positive for back pain and arthralgias. Negative for gait problem.  Neurological: Negative for weakness and numbness.  All other systems reviewed and are negative.     Allergies  Review of patient's allergies indicates no known allergies.  Home Medications   Prior to Admission medications   Medication Sig Start Date End Date Taking? Authorizing Provider  albuterol (PROVENTIL HFA;VENTOLIN HFA) 108 (90 BASE) MCG/ACT inhaler Inhale 2 puffs into the lungs every 4 (four) hours as needed for wheezing or shortness of breath.    Historical Provider, MD  calcium carbonate (TUMS - DOSED IN MG ELEMENTAL CALCIUM) 500 MG chewable tablet Chew 1 tablet by mouth daily.    Historical  Provider, MD  Fe Fum-Fe Poly-Vit C-Lactobac (FUSION) 65-65-25-30 MG CAPS Take 1 tablet by mouth daily.    Historical Provider, MD  HYDROcodone-acetaminophen (NORCO/VICODIN) 5-325 MG per tablet Take 1-2 tablets by mouth every 4 (four) hours as needed for moderate pain. 09/26/13   Armandina Gemma, MD  lisinopril-hydrochlorothiazide (PRINZIDE,ZESTORETIC) 20-12.5 MG per tablet Take 1 tablet by mouth every morning.     Historical Provider, MD  metFORMIN (GLUCOPHAGE) 500 MG tablet Take 500 mg by mouth 2 (two) times daily with a meal.    Historical Provider, MD  naproxen (NAPROSYN) 500 MG tablet Take 1 tablet (500 mg  total) by mouth 2 (two) times daily. 03/30/14   Verl Dicker, PA-C  promethazine (PHENERGAN) 25 MG tablet Take 1 tablet (25 mg total) by mouth every 6 (six) hours as needed for nausea or vomiting. 09/26/13   Armandina Gemma, MD   Triage Vitals: BP 163/101 mmHg  Pulse 94  Temp(Src) 98.5 F (36.9 C) (Oral)  Resp 18  SpO2 100%  LMP 03/20/2014  Physical Exam  Constitutional: She is oriented to person, place, and time. She appears well-developed and well-nourished.  HENT:  Head: Normocephalic and atraumatic.  Mouth/Throat: Oropharynx is clear and moist.  Eyes: Conjunctivae are normal. Pupils are equal, round, and reactive to light. Right eye exhibits no discharge. Left eye exhibits no discharge. No scleral icterus.  Neck: Neck supple.  Cardiovascular: Normal rate, regular rhythm and normal heart sounds.   Pulmonary/Chest: Effort normal and breath sounds normal. No respiratory distress. She has no wheezes. She has no rales.  Abdominal: Soft. There is no tenderness.  Musculoskeletal: She exhibits no tenderness.  Mild tenderness over dorsal aspect of distal forearm.  No overt erythema or warmth.  Maintains full active range of motion of all digits, wrist, and elbow,  Mild tenderness to right paraspinal lumbar muscles with no midline bony tenderness.  Neurological: She is alert and oriented to person, place, and time.  Cranial Nerves II-XII grossly intact. Motor and sensation 5/5 in all 4 extremities. Gait is baseline without ataxia  Skin: Skin is warm and dry. No rash noted.  Psychiatric: She has a normal mood and affect.  Nursing note and vitals reviewed.   ED Course  Procedures (including critical care time)  SPLINT APPLICATION Date/Time: 1:22 PM Authorized by: Verl Dicker Consent: Verbal consent obtained. Risks and benefits: risks, benefits and alternatives were discussed Consent given by: patient Splint applied by: orthopedic technician Location details: Right wrist   Splint type: Cock up splint  Supplies used: Velcro cock up splint  Post-procedure: The splinted body part was neurovascularly unchanged following the procedure. Patient tolerance: Patient tolerated the procedure well with no immediate complications.    DIAGNOSTIC STUDIES: Oxygen Saturation is 100% on room air, normal by my interpretation.    COORDINATION OF CARE: 11:00 AM- Will discharge patient with a prescription for antiinflammatory medication and a wrist brace.  The patient agreed to the treatment plan.  Labs Review Labs Reviewed - No data to display  Imaging Review No results found.   EKG Interpretation None     Meds given in ED:  Medications  ketorolac (TORADOL) injection 60 mg (60 mg Intramuscular Given 03/30/14 1114)    Discharge Medication List as of 03/30/2014 11:28 AM    START taking these medications   Details  naproxen (NAPROSYN) 500 MG tablet Take 1 tablet (500 mg total) by mouth 2 (two) times daily., Starting 03/30/2014, Until Discontinued, Print  Filed Vitals:   03/30/14 1031 03/30/14 1129  BP: 163/101 130/90  Pulse: 94 83  Temp: 98.5 F (36.9 C) 98.8 F (37.1 C)  TempSrc: Oral Oral  Resp: 18 22  SpO2: 100% 99%    MDM  Vitals stable - WNL -afebrile Pt resting comfortably in ED. PE not concerning further acute or emergent pathology. Patient maintains full active range of motion and is neurovascularly intact.  DDX--patient suffering from musculoskeletal discomfort, likely overuse injury. Will DC with NSAIDs and cock up wrist splint for patient comfort. NSAIDs will be used for back pain also. No pathologic back pain red flags. Encourage follow-up with PCP  I discussed all relevant lab findings and imaging results with pt and they verbalized understanding. Discussed f/u with PCP within 48 hrs and return precautions, pt very amenable to plan. Patient stable, in good condition and ambulatory out of the ED without difficulty  Final diagnoses:   Wrist pain, acute, right  Left-sided low back pain without sciatica    I personally performed the services described in this documentation, which was scribed in my presence. The recorded information has been reviewed and is accurate.    Viona Gilmore [place], PA-C 03/30/14 1753  Shaune Pollack, MD 03/31/14 [phone]

## 2014-03-30 NOTE — Discharge Instructions (Signed)
Arthralgia °Your caregiver has diagnosed you as suffering from an arthralgia. Arthralgia means there is pain in a joint. This can come from many reasons including: °· Bruising the joint which causes soreness (inflammation) in the joint. °· Wear and tear on the joints which occur as we grow older (osteoarthritis). °· Overusing the joint. °· Various forms of arthritis. °· Infections of the joint. °Regardless of the cause of pain in your joint, most of these different pains respond to anti-inflammatory drugs and rest. The exception to this is when a joint is infected, and these cases are treated with antibiotics, if it is a bacterial infection. °HOME CARE INSTRUCTIONS  °· Rest the injured area for as long as directed by your caregiver. Then slowly start using the joint as directed by your caregiver and as the pain allows. Crutches as directed may be useful if the ankles, knees or hips are involved. If the knee was splinted or casted, continue use and care as directed. If an stretchy or elastic wrapping bandage has been applied today, it should be removed and re-applied every 3 to 4 hours. It should not be applied tightly, but firmly enough to keep swelling down. Watch toes and feet for swelling, bluish discoloration, coldness, numbness or excessive pain. If any of these problems (symptoms) occur, remove the ace bandage and re-apply more loosely. If these symptoms persist, contact your caregiver or return to this location. °· For the first 24 hours, keep the injured extremity elevated on pillows while lying down. °· Apply ice for 15-20 minutes to the sore joint every couple hours while awake for the first half day. Then 03-04 times per day for the first 48 hours. Put the ice in a plastic bag and place a towel between the bag of ice and your skin. °· Wear any splinting, casting, elastic bandage applications, or slings as instructed. °· Only take over-the-counter or prescription medicines for pain, discomfort, or fever as  directed by your caregiver. Do not use aspirin immediately after the injury unless instructed by your physician. Aspirin can cause increased bleeding and bruising of the tissues. °· If you were given crutches, continue to use them as instructed and do not resume weight bearing on the sore joint until instructed. °Persistent pain and inability to use the sore joint as directed for more than 2 to 3 days are warning signs indicating that you should see a caregiver for a follow-up visit as soon as possible. Initially, a hairline fracture (break in bone) may not be evident on X-rays. Persistent pain and swelling indicate that further evaluation, non-weight bearing or use of the joint (use of crutches or slings as instructed), or further X-rays are indicated. X-rays may sometimes not show a small fracture until a week or 10 days later. Make a follow-up appointment with your own caregiver or one to whom we have referred you. A radiologist (specialist in reading X-rays) may read your X-rays. Make sure you know how you are to obtain your X-ray results. Do not assume everything is normal if you do not hear from us. °SEEK MEDICAL CARE IF: °Bruising, swelling, or pain increases. °SEEK IMMEDIATE MEDICAL CARE IF:  °· Your fingers or toes are numb or blue. °· The pain is not responding to medications and continues to stay the same or get worse. °· The pain in your joint becomes severe. °· You develop a fever over 102° F (38.9° C). °· It becomes impossible to move or use the joint. °MAKE SURE YOU:  °·   Understand these instructions.  Will watch your condition.  Will get help right away if you are not doing well or get worse. Document Released: 01/16/2005 Document Revised: 04/10/2011 Document Reviewed: 09/04/2007 [facility] Patient Information 2015 [place], [place]. This information is not intended to replace advice given to you by your health care provider. Make sure you discuss any questions you have with your health care  provider.  Is important for you to follow-up with your primary care for further evaluation and management of your symptoms. Return to ED for new or worsening symptoms. Please wear your brace during the day through activity and remove it during the night. Please take your anti-inflammatories for any discomfort he may experience.

## 2014-03-30 NOTE — ED Notes (Signed)
Pt c/o right wrist pain x 1 week. No known injury. ALSO, c/o LBP x 1 month.

## 2014-04-28 ENCOUNTER — Encounter (HOSPITAL_COMMUNITY): Payer: Self-pay | Admitting: Emergency Medicine

## 2014-04-28 ENCOUNTER — Emergency Department (HOSPITAL_COMMUNITY)
Admission: EM | Admit: 2014-04-28 | Discharge: 2014-04-28 | Disposition: A | Discharge status: Home / Self Care | Payer: Medicaid Other | Attending: Emergency Medicine | Admitting: Emergency Medicine

## 2014-04-28 DIAGNOSIS — M5416 Radiculopathy, lumbar region: Secondary | ICD-10-CM | POA: Insufficient documentation

## 2014-04-28 DIAGNOSIS — J45909 Unspecified asthma, uncomplicated: Secondary | ICD-10-CM | POA: Diagnosis not present

## 2014-04-28 DIAGNOSIS — I10 Essential (primary) hypertension: Secondary | ICD-10-CM | POA: Diagnosis not present

## 2014-04-28 DIAGNOSIS — Z720 Tobacco use: Secondary | ICD-10-CM | POA: Diagnosis not present

## 2014-04-28 DIAGNOSIS — Z791 Long term (current) use of non-steroidal anti-inflammatories (NSAID): Secondary | ICD-10-CM | POA: Diagnosis not present

## 2014-04-28 DIAGNOSIS — Z79899 Other long term (current) drug therapy: Secondary | ICD-10-CM | POA: Diagnosis not present

## 2014-04-28 DIAGNOSIS — K219 Gastro-esophageal reflux disease without esophagitis: Secondary | ICD-10-CM | POA: Insufficient documentation

## 2014-04-28 DIAGNOSIS — E119 Type 2 diabetes mellitus without complications: Secondary | ICD-10-CM | POA: Diagnosis not present

## 2014-04-28 DIAGNOSIS — D649 Anemia, unspecified: Secondary | ICD-10-CM | POA: Diagnosis not present

## 2014-04-28 DIAGNOSIS — M79605 Pain in left leg: Secondary | ICD-10-CM | POA: Diagnosis present

## 2014-04-28 MED ORDER — DIAZEPAM 5 MG PO TABS: 5.0000 mg | ORAL_TABLET | Freq: Two times a day (BID) | ORAL | Status: DC | PRN

## 2014-04-28 MED ORDER — MELOXICAM 7.5 MG PO TABS: 7.5000 mg | ORAL_TABLET | Freq: Two times a day (BID) | ORAL | Status: DC

## 2014-04-28 MED ORDER — TRAMADOL HCL 50 MG PO TABS: 50.0000 mg | ORAL_TABLET | Freq: Four times a day (QID) | ORAL | Status: DC | PRN

## 2014-04-28 NOTE — ED Notes (Signed)
Pt c/o low back pain radiating to lt hip and down lt leg x 5 days.

## 2014-04-28 NOTE — Discharge Instructions (Signed)
Take Valium as needed as directed for muscle spasm. No driving or operating heavy machinery while taking valium. This medication may cause drowsiness. Take meloxicam twice daily with food as directed. Take tramadol as directed for pain.  Lumbosacral Radiculopathy Lumbosacral radiculopathy is a pinched nerve or nerves in the low back (lumbosacral area). When this happens you may have weakness in your legs and may not be able to stand on your toes. You may have pain going down into your legs. There may be difficulties with walking normally. There are many causes of this problem. Sometimes this may happen from an injury, or simply from arthritis or boney problems. It may also be caused by other illnesses such as diabetes. If there is no improvement after treatment, further studies may be done to find the exact cause. DIAGNOSIS  X-rays may be needed if the problems become long standing. Electromyograms may be done. This study is one in which the working of nerves and muscles is studied. HOME CARE INSTRUCTIONS   Applications of ice packs may be helpful. Ice can be used in a plastic bag with a towel around it to prevent frostbite to skin. This may be used every 2 hours for 20 to 30 minutes, or as needed, while awake, or as directed by your caregiver.  Only take over-the-counter or prescription medicines for pain, discomfort, or fever as directed by your caregiver.  If physical therapy was prescribed, follow your caregiver's directions. SEEK IMMEDIATE MEDICAL CARE IF:   You have pain not controlled with medications.  You seem to be getting worse rather than better.  You develop increasing weakness in your legs.  You develop loss of bowel or bladder control.  You have difficulty with walking or balance, or develop clumsiness in the use of your legs.  You have a fever. MAKE SURE YOU:   Understand these instructions.  Will watch your condition.  Will get help right away if you are not doing  well or get worse. Document Released: 01/16/2005 Document Revised: 04/10/2011 Document Reviewed: 09/06/2007 [facility] Patient Information 2015 [place], [place]. This information is not intended to replace advice given to you by your health care provider. Make sure you discuss any questions you have with your health care provider.

## 2014-04-28 NOTE — ED Provider Notes (Signed)
CSN: [id number]     Arrival date & time 04/28/14  1341 History  This chart was scribed for non-physician practitioner, Lucien Mons, PA-C working with Noemi Chapel, MD by Frederich Balding, ED scribe. This patient was seen in room WTR7/WTR7 and the patient's care was started at 3:06 PM.   Chief Complaint  Patient presents with  . Leg Pain  . Hip Pain   The history is provided by the patient. No language interpreter was used.    HPI Comments: Crystal Brewer is a 39 y.o. female who presents to the Emergency Department complaining of sharp lower back pain that radiates into her left hip and posterior leg that started 5 days ago. Pt denies injury. She states pain worsened this morning and became 8/10. Ambulation and laying back worsens pain. There are no alleviating factors. Pt has seen her PCP for the same and was given a medication but she states she can not remember what it was. She has also taken 800 mg ibuprofen and a muscle relaxer with no relief. Pt denies fever, chills, night sweats, bowel or bladder incontinence.   PCP is Dr. Nolene Ebbs   Past Medical History  Diagnosis Date  . Thyroid disease   . Hypertension   . Anemia   . Asthma   . GERD (gastroesophageal reflux disease)   . Diabetes mellitus without complication   . Headache(784.0)   . Gallstones   . Bleeding disorder     "UNKNOWN PROBLEM" has not seen "blood doctor "yet   Past Surgical History  Procedure Laterality Date  . Cholecystectomy N/A 09/26/2013    Procedure: LAPAROSCOPIC CHOLECYSTECTOMY WITH INTRAOPERATIVE CHOLANGIOGRAM;  Surgeon: Earnstine Regal, MD;  Location: [facility];  Service: General;  Laterality: N/A;   Family History  Problem Relation Age of Onset  . Cancer Mother     Pancreatic Cancer   History  Substance Use Topics  . Smoking status: Current Every Day Smoker  . Smokeless tobacco: Not on file  . Alcohol Use: No     Comment: occ   OB History    No data available     Review of Systems  Constitutional:  Negative for fever and chills.  Genitourinary:       Negative for bowel or bladder incontinence.  Musculoskeletal: Positive for myalgias and back pain.  All other systems reviewed and are negative.  Allergies  Review of patient's allergies indicates no known allergies.  Home Medications   Prior to Admission medications   Medication Sig Start Date End Date Taking? Authorizing Provider  albuterol (PROVENTIL HFA;VENTOLIN HFA) 108 (90 BASE) MCG/ACT inhaler Inhale 2 puffs into the lungs every 4 (four) hours as needed for wheezing or shortness of breath.    Historical Provider, MD  calcium carbonate (TUMS - DOSED IN MG ELEMENTAL CALCIUM) 500 MG chewable tablet Chew 1 tablet by mouth daily.    Historical Provider, MD  diazepam (VALIUM) 5 MG tablet Take 1 tablet (5 mg total) by mouth every 12 (twelve) hours as needed for muscle spasms. 04/28/14   Carman Ching, PA-C  Fe Fum-Fe Poly-Vit C-Lactobac (FUSION) 65-65-25-30 MG CAPS Take 1 tablet by mouth daily.    Historical Provider, MD  HYDROcodone-acetaminophen (NORCO/VICODIN) 5-325 MG per tablet Take 1-2 tablets by mouth every 4 (four) hours as needed for moderate pain. 09/26/13   Armandina Gemma, MD  lisinopril-hydrochlorothiazide (PRINZIDE,ZESTORETIC) 20-12.5 MG per tablet Take 1 tablet by mouth every morning.     Historical Provider, MD  meloxicam (MOBIC) 7.5  MG tablet Take 1 tablet (7.5 mg total) by mouth 2 (two) times daily. 04/28/14   Rainier Feuerborn M Kimberlie Csaszar, PA-C  metFORMIN (GLUCOPHAGE) 500 MG tablet Take 500 mg by mouth 2 (two) times daily with a meal.    Historical Provider, MD  naproxen (NAPROSYN) 500 MG tablet Take 1 tablet (500 mg total) by mouth 2 (two) times daily. 03/30/14   Comer Locket, PA-C  promethazine (PHENERGAN) 25 MG tablet Take 1 tablet (25 mg total) by mouth every 6 (six) hours as needed for nausea or vomiting. 09/26/13   Armandina Gemma, MD  traMADol (ULTRAM) 50 MG tablet Take 1 tablet (50 mg total) by mouth every 6 (six) hours as needed. 04/28/14    Blandon Offerdahl M Keeli Roberg, PA-C   BP 110/71 mmHg  Pulse 92  Temp(Src) 98.8 F (37.1 C) (Oral)  Resp 18  SpO2 100%  LMP 03/20/2014   Physical Exam  Constitutional: She is oriented to person, place, and time. She appears well-developed and well-nourished. No distress.  Morbidly obese.   HENT:  Head: Normocephalic and atraumatic.  Mouth/Throat: Oropharynx is clear and moist.  Eyes: Conjunctivae are normal.  Neck: Normal range of motion. Neck supple. No spinous process tenderness and no muscular tenderness present.  Cardiovascular: Normal rate, regular rhythm and normal heart sounds.   Pulmonary/Chest: Effort normal and breath sounds normal. No respiratory distress.  Musculoskeletal: She exhibits no edema.  Tender to palpation on left lumbar paraspinal muscles and left buttock. Positive straight leg raise on the left.  Neurological: She is alert and oriented to person, place, and time. She has normal strength.  Strength lower extremities 5/5 and equal bilateral. Sensation intact. Normal gait.  Skin: Skin is warm and dry. No rash noted. She is not diaphoretic.  Psychiatric: She has a normal mood and affect. Her behavior is normal.  Nursing note and vitals reviewed.   ED Course  Procedures (including critical care time)  DIAGNOSTIC STUDIES: Oxygen Saturation is 100% on RA, normal by my interpretation.    COORDINATION OF CARE: 3:11 PM-Discussed treatment plan which includes valium, tramadol and mobic with pt at bedside and pt agreed to plan. Advised pt to follow up with her PCP.   Labs Review Labs Reviewed - No data to display  Imaging Review No results found.   EKG Interpretation None      MDM   Final diagnoses:  Lumbar radiculopathy   No red flags concerning patient's back pain. No s/s of central cord compression or cauda equina. Lower extremities are neurovascularly intact and patient is ambulating without difficulty. Stable for d/c. Return precautions given. Patient states  understanding of treatment care plan and is agreeable.  I personally performed the services described in this documentation, which was scribed in my presence. The recorded information has been reviewed and is accurate.  Carman Ching, PA-C 04/28/14 [place], MD 04/28/14 [phone]

## 2014-05-27 ENCOUNTER — Other Ambulatory Visit: Payer: Self-pay

## 2014-05-27 DIAGNOSIS — Z1231 Encounter for screening mammogram for malignant neoplasm of breast: Secondary | ICD-10-CM

## 2014-06-04 ENCOUNTER — Encounter (INDEPENDENT_AMBULATORY_CARE_PROVIDER_SITE_OTHER): Payer: Self-pay

## 2014-06-04 ENCOUNTER — Ambulatory Visit
Admission: RE | Admit: 2014-06-04 | Discharge: 2014-06-04 | Disposition: A | Discharge status: Home / Self Care | Payer: Medicaid Other | Source: Ambulatory Visit

## 2014-06-04 DIAGNOSIS — Z1231 Encounter for screening mammogram for malignant neoplasm of breast: Secondary | ICD-10-CM

## 2014-07-29 ENCOUNTER — Ambulatory Visit
Admission: RE | Admit: 2014-07-29 | Discharge: 2014-07-29 | Disposition: A | Discharge status: Home / Self Care | Payer: Medicaid Other | Source: Ambulatory Visit | Attending: Psychiatry | Admitting: Psychiatry

## 2014-07-29 ENCOUNTER — Other Ambulatory Visit: Payer: Self-pay | Admitting: Psychiatry

## 2014-07-29 DIAGNOSIS — M545 Low back pain: Secondary | ICD-10-CM

## 2014-08-18 ENCOUNTER — Emergency Department (HOSPITAL_COMMUNITY)
Admission: EM | Admit: 2014-08-18 | Discharge: 2014-08-18 | Disposition: A | Discharge status: Home / Self Care | Payer: Medicaid Other | Attending: Emergency Medicine | Admitting: Emergency Medicine

## 2014-08-18 ENCOUNTER — Encounter (HOSPITAL_COMMUNITY): Payer: Self-pay | Admitting: *Deleted

## 2014-08-18 DIAGNOSIS — H1012 Acute atopic conjunctivitis, left eye: Secondary | ICD-10-CM | POA: Insufficient documentation

## 2014-08-18 DIAGNOSIS — Z720 Tobacco use: Secondary | ICD-10-CM | POA: Insufficient documentation

## 2014-08-18 DIAGNOSIS — Z862 Personal history of diseases of the blood and blood-forming organs and certain disorders involving the immune mechanism: Secondary | ICD-10-CM | POA: Diagnosis not present

## 2014-08-18 DIAGNOSIS — Z8719 Personal history of other diseases of the digestive system: Secondary | ICD-10-CM | POA: Diagnosis not present

## 2014-08-18 DIAGNOSIS — Z79899 Other long term (current) drug therapy: Secondary | ICD-10-CM | POA: Insufficient documentation

## 2014-08-18 DIAGNOSIS — E079 Disorder of thyroid, unspecified: Secondary | ICD-10-CM | POA: Insufficient documentation

## 2014-08-18 DIAGNOSIS — J45909 Unspecified asthma, uncomplicated: Secondary | ICD-10-CM | POA: Insufficient documentation

## 2014-08-18 DIAGNOSIS — I10 Essential (primary) hypertension: Secondary | ICD-10-CM | POA: Diagnosis not present

## 2014-08-18 DIAGNOSIS — Z791 Long term (current) use of non-steroidal anti-inflammatories (NSAID): Secondary | ICD-10-CM | POA: Diagnosis not present

## 2014-08-18 DIAGNOSIS — H5712 Ocular pain, left eye: Secondary | ICD-10-CM | POA: Diagnosis present

## 2014-08-18 DIAGNOSIS — E119 Type 2 diabetes mellitus without complications: Secondary | ICD-10-CM | POA: Diagnosis not present

## 2014-08-18 MED ORDER — ACETAMINOPHEN 325 MG PO TABS
650.0000 mg | ORAL_TABLET | Freq: Once | ORAL | Status: AC
  Administered 2014-08-18: 650 mg via ORAL
  Filled 2014-08-18: qty 2

## 2014-08-18 MED ORDER — KETOROLAC TROMETHAMINE 0.5 % OP SOLN: 1.0000 [drp] | Freq: Four times a day (QID) | OPHTHALMIC | Status: DC

## 2014-08-18 MED ORDER — CETIRIZINE HCL 10 MG PO TABS: 10.0000 mg | ORAL_TABLET | Freq: Every day | ORAL | Status: DC

## 2014-08-18 MED ORDER — FLUORESCEIN SODIUM 1 MG OP STRP
1.0000 | ORAL_STRIP | Freq: Once | OPHTHALMIC | Status: AC
  Administered 2014-08-18: 1 via OPHTHALMIC
  Filled 2014-08-18: qty 1

## 2014-08-18 MED ORDER — TETRACAINE HCL 0.5 % OP SOLN
1.0000 [drp] | Freq: Once | OPHTHALMIC | Status: AC
  Administered 2014-08-18: 1 [drp] via OPHTHALMIC
  Filled 2014-08-18: qty 2

## 2014-08-18 NOTE — ED Provider Notes (Signed)
CSN: [id number]     Arrival date & time 08/18/14  1914 History  This chart was scribed for non-physician practitioner, [name], PA-C working with Evelina Bucy, MD by Rayna Sexton, ED scribe. This patient was seen in room WTR7/WTR7 and the patient's care was started at 7:40 PM.   Chief Complaint  Patient presents with  . Eye Pain   The history is provided by the patient. No language interpreter was used.    HPI Comments: Dannie Woolen is a 39 y.o. female who presents to the Emergency Department complaining of constant, mild, redness and swelling to her left eye with onset 2 days ago. She describes it as "itching and burning", rates it as a 10/10, hasn't taken any medications for her symptoms and notes associated mild photophobia, post nasal drip and rhinorrhea. She reports having watery discharge intermittently. Pt notes a hx of allergies and eye problems noting this isn't the first time she has experienced these symptoms. She denies matting. She denies eye injury or foreign body sensation. She denies wearing contacts or glasses. She denies any double vision, blurry vision, ear pain, sore throat, wheezing, SOB, eye discharge, fever, chills or cough.   Past Medical History  Diagnosis Date  . Thyroid disease   . Hypertension   . Anemia   . Asthma   . GERD (gastroesophageal reflux disease)   . Diabetes mellitus without complication   . Headache(784.0)   . Gallstones   . Bleeding disorder     "UNKNOWN PROBLEM" has not seen "blood doctor "yet   Past Surgical History  Procedure Laterality Date  . Cholecystectomy N/A 09/26/2013    Procedure: LAPAROSCOPIC CHOLECYSTECTOMY WITH INTRAOPERATIVE CHOLANGIOGRAM;  Surgeon: Earnstine Regal, MD;  Location: [facility];  Service: General;  Laterality: N/A;   Family History  Problem Relation Age of Onset  . Cancer Mother     Pancreatic Cancer   History  Substance Use Topics  . Smoking status: Current Every Day Smoker  . Smokeless tobacco: Not on  file  . Alcohol Use: No     Comment: occ   OB History    No data available     Review of Systems  Constitutional: Negative for fever and chills.  HENT: Positive for rhinorrhea. Negative for ear pain and sore throat.   Eyes: Positive for photophobia, pain and redness. Negative for discharge and visual disturbance.  Respiratory: Negative for cough, shortness of breath and wheezing.   Skin: Negative for rash.   Allergies  Review of patient's allergies indicates no known allergies.  Home Medications   Prior to Admission medications   Medication Sig Start Date End Date Taking? Authorizing Provider  albuterol (PROVENTIL HFA;VENTOLIN HFA) 108 (90 BASE) MCG/ACT inhaler Inhale 2 puffs into the lungs every 4 (four) hours as needed for wheezing or shortness of breath.    Historical Provider, MD  calcium carbonate (TUMS - DOSED IN MG ELEMENTAL CALCIUM) 500 MG chewable tablet Chew 1 tablet by mouth daily.    Historical Provider, MD  cetirizine (ZYRTEC ALLERGY) 10 MG tablet Take 1 tablet (10 mg total) by mouth daily. 08/18/14   [name], PA-C  diazepam (VALIUM) 5 MG tablet Take 1 tablet (5 mg total) by mouth every 12 (twelve) hours as needed for muscle spasms. 04/28/14   Carman Ching, PA-C  Fe Fum-Fe Poly-Vit C-Lactobac (FUSION) 65-65-25-30 MG CAPS Take 1 tablet by mouth daily.    Historical Provider, MD  HYDROcodone-acetaminophen (NORCO/VICODIN) 5-325 MG per tablet Take 1-2 tablets  by mouth every 4 (four) hours as needed for moderate pain. 09/26/13   Armandina Gemma, MD  ketorolac (ACULAR) 0.5 % ophthalmic solution Place 1 drop into the left eye 4 (four) times daily. 08/18/14   [name], PA-C  lisinopril-hydrochlorothiazide (PRINZIDE,ZESTORETIC) 20-12.5 MG per tablet Take 1 tablet by mouth every morning.     Historical Provider, MD  meloxicam (MOBIC) 7.5 MG tablet Take 1 tablet (7.5 mg total) by mouth 2 (two) times daily. 04/28/14   Robyn M Hess, PA-C  metFORMIN (GLUCOPHAGE) 500 MG tablet Take  500 mg by mouth 2 (two) times daily with a meal.    Historical Provider, MD  naproxen (NAPROSYN) 500 MG tablet Take 1 tablet (500 mg total) by mouth 2 (two) times daily. 03/30/14   Comer Locket, PA-C  promethazine (PHENERGAN) 25 MG tablet Take 1 tablet (25 mg total) by mouth every 6 (six) hours as needed for nausea or vomiting. 09/26/13   Armandina Gemma, MD  traMADol (ULTRAM) 50 MG tablet Take 1 tablet (50 mg total) by mouth every 6 (six) hours as needed. 04/28/14   Robyn M Hess, PA-C   BP 126/78 mmHg  Pulse 95  Temp(Src) 99 F (37.2 C) (Oral)  Resp 18  SpO2 98%  LMP 07/29/2014 Physical Exam  Constitutional: She is oriented to person, place, and time. She appears well-developed and well-nourished. No distress.  Nontoxic appearing.  HENT:  Head: Normocephalic and atraumatic.  Right Ear: External ear normal.  Left Ear: External ear normal.  Mouth/Throat: Oropharynx is clear and moist. No oropharyngeal exudate.  No tonsillar hypertrophy or exudates. Uvula is midline without edema. There is mild posterior oropharyngeal erythema without edema. Bilateral tympanic membranes are pearly-gray without erythema or loss of landmarks.   Eyes: EOM are normal. Pupils are equal, round, and reactive to light. Right eye exhibits no discharge. Left eye exhibits discharge.  Left sided conjunctival injection with small amount of watery discharge. Bilateral eyeballs are soft. There is mild upper left eye lid edema and no lower lid edema; able to open her eyelids without difficulty. No erythema of her eyelids. Eyes anesthetized with tetracaine and stained with fluorescein. No evidence of corneal abrasion, ulcer, or herpetic lesions. She reports good relief of her pain with tetracaine. Vision is grossly intact. Extraocular movements are intact.  Neck: Normal range of motion. Neck supple. No JVD present.  Cardiovascular: Normal rate, regular rhythm, normal heart sounds and intact distal pulses.   Pulmonary/Chest:  Effort normal and breath sounds normal. No respiratory distress. She has no wheezes. She has no rales.  Lungs are clear to auscultation bilaterally.  Lymphadenopathy:    She has no cervical adenopathy.  Neurological: She is alert and oriented to person, place, and time. Coordination normal.  Skin: Skin is warm and dry. No rash noted. She is not diaphoretic.  Psychiatric: She has a normal mood and affect. Her behavior is normal.  Nursing note and vitals reviewed.   ED Course  Procedures  DIAGNOSTIC STUDIES: Oxygen Saturation is 98% on RA, normal by my interpretation.    COORDINATION OF CARE: 7:47 PM Discussed treatment plan with pt at bedside and pt agreed to plan.  Labs Review Labs Reviewed - No data to display  Imaging Review No results found.   EKG Interpretation None      Filed Vitals:   08/18/14 1928  BP: 126/78  Pulse: 95  Temp: 99 F (37.2 C)  TempSrc: Oral  Resp: 18  SpO2: 98%  MDM   Meds given in ED:  Medications  acetaminophen (TYLENOL) tablet 650 mg (650 mg Oral Given 08/18/14 2001)  tetracaine (PONTOCAINE) 0.5 % ophthalmic solution 1 drop (1 drop Both Eyes Given 08/18/14 2002)  fluorescein ophthalmic strip 1 strip (1 strip Both Eyes Given 08/18/14 2002)    Discharge Medication List as of 08/18/2014  8:10 PM    START taking these medications   Details  cetirizine (ZYRTEC ALLERGY) 10 MG tablet Take 1 tablet (10 mg total) by mouth daily., Starting 08/18/2014, Until Discontinued, Print    ketorolac (ACULAR) 0.5 % ophthalmic solution Place 1 drop into the left eye 4 (four) times daily., Starting 08/18/2014, Until Discontinued, Print        Final diagnoses:  Allergic conjunctivitis, left   This is a 39 y.o. female who presents to the Emergency Department complaining of constant, mild, redness and swelling to her left eye with onset 2 days ago. She describes it as "itching and burning", rates it as a 10/10, hasn't taken any medications for her symptoms  and notes associated mild photophobia, post nasal drip and rhinorrhea. She reports having watery discharge intermittently.  On exam the patient is afebrile and nontoxic appearing. The patient's eye was anesthetized with tetracaine and stained with fluorescein. No evidence of corneal abrasion, ulcers, or herpetic lesions. Patient's extraocular movements are intact. Her bilateral eyeballs are soft. The patient does have a mild amount of upper lid edema without erythema. Patient's exam is consistent with an allergic conjunctivitis with her associated symptoms of rhinorrhea and postnasal drip. Her lungs are clear to ascultation bilaterally. She denies any matting or purulent discharge. We'll discharge with prescription for acular ophthalmic solution and Zyrtec Allergy. I advised the patient if her symptoms persist she'll follow-up with ophthalmologist Dr. Ellie Lunch. I advised the patient to follow-up with their primary care provider this week. I advised the patient to return to the emergency department with new or worsening symptoms or new concerns. The patient verbalized understanding and agreement with plan.    I personally performed the services described in this documentation, which was scribed in my presence. The recorded information has been reviewed and is accurate.    [name], PA-C 08/18/14 2029  Evelina Bucy, MD 08/18/14 [phone]

## 2014-08-18 NOTE — Discharge Instructions (Signed)
Allergic Conjunctivitis °The conjunctiva is a thin membrane that covers the visible white part of the eyeball and the underside of the eyelids. This membrane protects and lubricates the eye. The membrane has small blood vessels running through it that can normally be seen. When the conjunctiva becomes inflamed, the condition is called conjunctivitis. In response to the inflammation, the conjunctival blood vessels become swollen. The swelling results in redness in the normally white part of the eye. °The blood vessels of this membrane also react when a person has allergies and is then called allergic conjunctivitis. This condition usually lasts for as long as the allergy persists. Allergic conjunctivitis cannot be passed to another person (non-contagious). The likelihood of bacterial infection is great and the cause is not likely due to allergies if the inflamed eye has: °· A sticky discharge. °· Discharge or sticking together of the lids in the morning. °· Scaling or flaking of the eyelids where the eyelashes come out. °· Red swollen eyelids. °CAUSES  °· Viruses. °· Irritants such as foreign bodies. °· Chemicals. °· General allergic reactions. °· Inflammation or serious diseases in the inside or the outside of the eye or the orbit (the boney cavity in which the eye sits) can cause a "red eye." °SYMPTOMS  °· Eye redness. °· Tearing. °· Itchy eyes. °· Burning feeling in the eyes. °· Clear drainage from the eye. °· Allergic reaction due to pollens or ragweed sensitivity. Seasonal allergic conjunctivitis is frequent in the spring when pollens are in the air and in the fall. °DIAGNOSIS  °This condition, in its many forms, is usually diagnosed based on the history and an ophthalmological exam. It usually involves both eyes. If your eyes react at the same time every year, allergies may be the cause. While most "red eyes" are due to allergy or an infection, the role of an eye (ophthalmological) exam is important. The exam  can rule out serious diseases of the eye or orbit. °TREATMENT  °· Non-antibiotic eye drops, ointments, or medications by mouth may be prescribed if the ophthalmologist is sure the conjunctivitis is due to allergies alone. °· Over-the-counter drops and ointments for allergic symptoms should be used only after other causes of conjunctivitis have been ruled out, or as your caregiver suggests. °Medications by mouth are often prescribed if other allergy-related symptoms are present. If the ophthalmologist is sure that the conjunctivitis is due to allergies alone, treatment is normally limited to drops or ointments to reduce itching and burning. °HOME CARE INSTRUCTIONS  °· Wash hands before and after applying drops or ointments, or touching the inflamed eye(s) or eyelids. °· Do not let the eye dropper tip or ointment tube touch the eyelid when putting medicine in your eye. °· Stop using your soft contact lenses and throw them away. Use a new pair of lenses when recovery is complete. You should run through sterilizing cycles at least three times before use after complete recovery if the old soft contact lenses are to be used. Hard contact lenses should be stopped. They need to be thoroughly sterilized before use after recovery. °· Itching and burning eyes due to allergies is often relieved by using a cool cloth applied to closed eye(s). °SEEK MEDICAL CARE IF:  °· Your problems do not go away after two or three days of treatment. °· Your lids are sticky (especially in the morning when you wake up) or stick together. °· Discharge develops. Antibiotics may be needed either as drops, ointment, or by mouth. °· You   have extreme light sensitivity. °· An oral temperature above 102° F (38.9° C) develops. °· Pain in or around the eye or any other visual symptom develops. °MAKE SURE YOU:  °· Understand these instructions. °· Will watch your condition. °· Will get help right away if you are not doing well or get worse. °Document  Released: 04/08/2002 Document Revised: 04/10/2011 Document Reviewed: 03/04/2007 °[employer]® Patient Information ©2015 [employer]. This information is not intended to replace advice given to you by your health care provider. Make sure you discuss any questions you have with your health care provider. ° °Allergies °Allergies may happen from anything your body is sensitive to. This may be food, medicines, pollens, chemicals, and nearly anything around you in everyday life that produces allergens. An allergen is anything that causes an allergy producing substance. Heredity is often a factor in causing these problems. This means you may have some of the same allergies as your parents. °Food allergies happen in all age groups. Food allergies are some of the most severe and life threatening. Some common food allergies are cow's milk, seafood, eggs, nuts, wheat, and soybeans. °SYMPTOMS  °· Swelling around the mouth. °· An itchy red rash or hives. °· Vomiting or diarrhea. °· Difficulty breathing. °SEVERE ALLERGIC REACTIONS ARE LIFE-THREATENING. °This reaction is called anaphylaxis. It can cause the mouth and throat to swell and cause difficulty with breathing and swallowing. In severe reactions only a trace amount of food (for example, peanut oil in a salad) may cause death within seconds. °Seasonal allergies occur in all age groups. These are seasonal because they usually occur during the same season every year. They may be a reaction to molds, grass pollens, or tree pollens. Other causes of problems are [place] dust mite allergens, pet dander, and mold spores. The symptoms often consist of nasal congestion, a runny itchy nose associated with sneezing, and tearing itchy eyes. There is often an associated itching of the mouth and ears. The problems happen when you come in contact with pollens and other allergens. Allergens are the particles in the air that the body reacts to with an allergic reaction. This causes you to  release allergic antibodies. Through a chain of events, these eventually cause you to release histamine into the blood stream. Although it is meant to be protective to the body, it is this release that causes your discomfort. This is why you were given anti-histamines to feel better.  If you are unable to pinpoint the offending allergen, it may be determined by skin or blood testing. Allergies cannot be cured but can be controlled with medicine. °Hay fever is a collection of all or some of the seasonal allergy problems. It may often be treated with simple over-the-counter medicine such as diphenhydramine. Take medicine as directed. Do not drink alcohol or drive while taking this medicine. Check with your caregiver or package insert for child dosages. °If these medicines are not effective, there are many new medicines your caregiver can prescribe. Stronger medicine such as nasal spray, eye drops, and corticosteroids may be used if the first things you try do not work well. Other treatments such as immunotherapy or desensitizing injections can be used if all else fails. Follow up with your caregiver if problems continue. These seasonal allergies are usually not life threatening. They are generally more of a nuisance that can often be handled using medicine. °HOME CARE INSTRUCTIONS  °· If unsure what causes a reaction, keep a diary of foods eaten and symptoms that follow. Avoid   foods that cause reactions. °· If hives or rash are present: °¨ Take medicine as directed. °¨ You may use an over-the-counter antihistamine (diphenhydramine) for hives and itching as needed. °¨ Apply cold compresses (cloths) to the skin or take baths in cool water. Avoid hot baths or showers. Heat will make a rash and itching worse. °· If you are severely allergic: °¨ Following a treatment for a severe reaction, hospitalization is often required for closer follow-up. °¨ Wear a medic-alert bracelet or necklace stating the allergy. °¨ You and your  family must learn how to give adrenaline or use an anaphylaxis kit. °¨ If you have had a severe reaction, always carry your anaphylaxis kit or EpiPen® with you. Use this medicine as directed by your caregiver if a severe reaction is occurring. Failure to do so could have a fatal outcome. °SEEK MEDICAL CARE IF: °· You suspect a food allergy. Symptoms generally happen within 30 minutes of eating a food. °· Your symptoms have not gone away within 2 days or are getting worse. °· You develop new symptoms. °· You want to retest yourself or your child with a food or drink you think causes an allergic reaction. Never do this if an anaphylactic reaction to that food or drink has happened before. Only do this under the care of a caregiver. °SEEK IMMEDIATE MEDICAL CARE IF:  °· You have difficulty breathing, are wheezing, or have a tight feeling in your chest or throat. °· You have a swollen mouth, or you have hives, swelling, or itching all over your body. °· You have had a severe reaction that has responded to your anaphylaxis kit or an EpiPen®. These reactions may return when the medicine has worn off. These reactions should be considered life threatening. °MAKE SURE YOU:  °· Understand these instructions. °· Will watch your condition. °· Will get help right away if you are not doing well or get worse. °Document Released: 04/11/2002 Document Revised: 05/13/2012 Document Reviewed: 09/16/2007 °[employer]® Patient Information ©2015 [employer]. This information is not intended to replace advice given to you by your health care provider. Make sure you discuss any questions you have with your health care provider. ° °

## 2014-08-18 NOTE — ED Notes (Signed)
Pt complains of pain, redness and swelling in her left eye for the past 2 days. Pt denies injury to eye.

## 2014-08-29 ENCOUNTER — Encounter (HOSPITAL_COMMUNITY): Payer: Self-pay | Admitting: Emergency Medicine

## 2014-08-29 ENCOUNTER — Emergency Department (HOSPITAL_COMMUNITY)
Admission: EM | Admit: 2014-08-29 | Discharge: 2014-08-29 | Disposition: A | Discharge status: Home / Self Care | Payer: Medicaid Other | Attending: Emergency Medicine | Admitting: Emergency Medicine

## 2014-08-29 DIAGNOSIS — K219 Gastro-esophageal reflux disease without esophagitis: Secondary | ICD-10-CM | POA: Diagnosis not present

## 2014-08-29 DIAGNOSIS — N9489 Other specified conditions associated with female genital organs and menstrual cycle: Secondary | ICD-10-CM | POA: Insufficient documentation

## 2014-08-29 DIAGNOSIS — N898 Other specified noninflammatory disorders of vagina: Secondary | ICD-10-CM | POA: Insufficient documentation

## 2014-08-29 DIAGNOSIS — Z720 Tobacco use: Secondary | ICD-10-CM | POA: Diagnosis not present

## 2014-08-29 DIAGNOSIS — E119 Type 2 diabetes mellitus without complications: Secondary | ICD-10-CM | POA: Insufficient documentation

## 2014-08-29 DIAGNOSIS — R300 Dysuria: Secondary | ICD-10-CM | POA: Diagnosis present

## 2014-08-29 DIAGNOSIS — J45909 Unspecified asthma, uncomplicated: Secondary | ICD-10-CM | POA: Diagnosis not present

## 2014-08-29 DIAGNOSIS — D649 Anemia, unspecified: Secondary | ICD-10-CM | POA: Insufficient documentation

## 2014-08-29 DIAGNOSIS — Z79899 Other long term (current) drug therapy: Secondary | ICD-10-CM | POA: Diagnosis not present

## 2014-08-29 DIAGNOSIS — I10 Essential (primary) hypertension: Secondary | ICD-10-CM | POA: Diagnosis not present

## 2014-08-29 DIAGNOSIS — N766 Ulceration of vulva: Secondary | ICD-10-CM

## 2014-08-29 LAB — URINE MICROSCOPIC-ADD ON

## 2014-08-29 LAB — WET PREP, GENITAL
Clue Cells Wet Prep HPF POC: NONE SEEN
Trich, Wet Prep: NONE SEEN
YEAST WET PREP: NONE SEEN

## 2014-08-29 LAB — URINALYSIS, ROUTINE W REFLEX MICROSCOPIC
BILIRUBIN URINE: NEGATIVE
Ketones, ur: NEGATIVE mg/dL
Leukocytes, UA: NEGATIVE
NITRITE: NEGATIVE
PH: 5 (ref 5.0–8.0)
Protein, ur: NEGATIVE mg/dL
SPECIFIC GRAVITY, URINE: 1.036 — AB (ref 1.005–1.030)
Urobilinogen, UA: 0.2 mg/dL (ref 0.0–1.0)

## 2014-08-29 LAB — I-STAT CHEM 8, ED
BUN: 14 mg/dL (ref 6–20)
CHLORIDE: 101 mmol/L (ref 101–111)
Calcium, Ion: 1.2 mmol/L (ref 1.12–1.23)
Creatinine, Ser: 0.8 mg/dL (ref 0.44–1.00)
GLUCOSE: 100 mg/dL — AB (ref 65–99)
HEMATOCRIT: 37 % (ref 36.0–46.0)
Hemoglobin: 12.6 g/dL (ref 12.0–15.0)
POTASSIUM: 3.7 mmol/L (ref 3.5–5.1)
Sodium: 138 mmol/L (ref 135–145)
TCO2: 21 mmol/L (ref 0–100)

## 2014-08-29 MED ORDER — IBUPROFEN 800 MG PO TABS: 800.0000 mg | ORAL_TABLET | Freq: Three times a day (TID) | ORAL | Status: DC

## 2014-08-29 NOTE — Discharge Instructions (Signed)
Monilial Vaginitis Vaginitis in a soreness, swelling and redness (inflammation) of the vagina and vulva. Monilial vaginitis is not a sexually transmitted infection. CAUSES  Yeast vaginitis is caused by yeast (candida) that is normally found in your vagina. With a yeast infection, the candida has overgrown in number to a point that upsets the chemical balance. SYMPTOMS   White, thick vaginal discharge.  Swelling, itching, redness and irritation of the vagina and possibly the lips of the vagina (vulva).  Burning or painful urination.  Painful intercourse. DIAGNOSIS  Things that may contribute to monilial vaginitis are:  Postmenopausal and virginal states.  Pregnancy.  Infections.  Being tired, sick or stressed, especially if you had monilial vaginitis in the past.  Diabetes. Good control will help lower the chance.  Birth control pills.  Tight fitting garments.  Using bubble bath, feminine sprays, douches or deodorant tampons.  Taking certain medications that kill germs (antibiotics).  Sporadic recurrence can occur if you become ill. TREATMENT  Your caregiver will give you medication.  There are several kinds of anti monilial vaginal creams and suppositories specific for monilial vaginitis. For recurrent yeast infections, use a suppository or cream in the vagina 2 times a week, or as directed.  Anti-monilial or steroid cream for the itching or irritation of the vulva may also be used. Get your caregiver's permission.  Painting the vagina with methylene blue solution may help if the monilial cream does not work.  Eating yogurt may help prevent monilial vaginitis. HOME CARE INSTRUCTIONS   Finish all medication as prescribed.  Do not have sex until treatment is completed or after your caregiver tells you it is okay.  Take warm sitz baths.  Do not douche.  Do not use tampons, especially scented ones.  Wear cotton underwear.  Avoid tight pants and panty  hose.  Tell your sexual partner that you have a yeast infection. They should go to their caregiver if they have symptoms such as mild rash or itching.  Your sexual partner should be treated as well if your infection is difficult to eliminate.  Practice safer sex. Use condoms.  Some vaginal medications cause latex condoms to fail. Vaginal medications that harm condoms are:  Cleocin cream.  Butoconazole (Femstat).  Terconazole (Terazol) vaginal suppository.  Miconazole (Monistat) (may be purchased over the counter). SEEK MEDICAL CARE IF:   You have a temperature by mouth above 102 F (38.9 C).  The infection is getting worse after 2 days of treatment.  The infection is not getting better after 3 days of treatment.  You develop blisters in or around your vagina.  You develop vaginal bleeding, and it is not your menstrual period.  You have pain when you urinate.  You develop intestinal problems.  You have pain with sexual intercourse. Document Released: 10/26/2004 Document Revised: 04/10/2011 Document Reviewed: 07/10/2008 [facility] Patient Information 2015 [place], [place]. This information is not intended to replace advice given to you by your health care provider. Make sure you discuss any questions you have with your health care provider. Dysuria Dysuria is the medical term for pain with urination. There are many causes for dysuria, but urinary tract infection is the most common. If a urinalysis was performed it can show that there is a urinary tract infection. A urine culture confirms that you or your child is sick. You will need to follow up with a healthcare provider because:  If a urine culture was done you will need to know the culture results and treatment recommendations.  If the urine culture was positive, you or your child will need to be put on antibiotics or know if the antibiotics prescribed are the right antibiotics for your urinary tract infection.  If the  urine culture is negative (no urinary tract infection), then other causes may need to be explored or antibiotics need to be stopped. Today laboratory work may have been done and there does not seem to be an infection. If cultures were done they will take at least 24 to 48 hours to be completed. Today x-rays may have been taken and they read as normal. No cause can be found for the problems. The x-rays may be re-read by a radiologist and you will be contacted if additional findings are made. You or your child may have been put on medications to help with this problem until you can see your primary caregiver. If the problems get better, see your primary caregiver if the problems return. If you were given antibiotics (medications which kill germs), take all of the mediations as directed for the full course of treatment.  If laboratory work was done, you need to find the results. Leave a telephone number where you can be reached. If this is not possible, make sure you find out how you are to get test results. HOME CARE INSTRUCTIONS   Drink lots of fluids. For adults, drink eight, 8 ounce glasses of clear juice or water a day. For children, replace fluids as suggested by your caregiver.  Empty the bladder often. Avoid holding urine for long periods of time.  After a bowel movement, women should cleanse front to back, using each tissue only once.  Empty your bladder before and after sexual intercourse.  Take all the medicine given to you until it is gone. You may feel better in a few days, but TAKE ALL MEDICINE.  Avoid caffeine, tea, alcohol and carbonated beverages, because they tend to irritate the bladder.  In men, alcohol may irritate the prostate.  Only take over-the-counter or prescription medicines for pain, discomfort, or fever as directed by your caregiver.  If your caregiver has given you a follow-up appointment, it is very important to keep that appointment. Not keeping the appointment  could result in a chronic or permanent injury, pain, and disability. If there is any problem keeping the appointment, you must call back to this facility for assistance. SEEK IMMEDIATE MEDICAL CARE IF:   Back pain develops.  A fever develops.  There is nausea (feeling sick to your stomach) or vomiting (throwing up).  Problems are no better with medications or are getting worse. MAKE SURE YOU:   Understand these instructions.  Will watch your condition.  Will get help right away if you are not doing well or get worse. Document Released: 10/15/2003 Document Revised: 04/10/2011 Document Reviewed: 08/22/2007 [facility] Patient Information 2015 [place], [place]. This information is not intended to replace advice given to you by your health care provider. Make sure you discuss any questions you have with your health care provider.

## 2014-08-29 NOTE — ED Notes (Signed)
Pt c/o burning with urination and irritation to area.

## 2014-08-29 NOTE — ED Provider Notes (Signed)
CSN: [id number]     Arrival date & time 08/29/14  1716 History   First MD Initiated Contact with Patient 08/29/14 1746     Chief Complaint  Patient presents with  . Dysuria     (Consider location/radiation/quality/duration/timing/severity/associated sxs/prior Treatment) HPI Comments: Patient presents to the emergency department with chief complaint of vaginal discharge and dysuria. Patient states she's been having the symptoms for the past 4 weeks. She has been taking fluconazole with no relief. She denies any fevers, chills, nausea, or vomiting. Denies any alleviating symptoms. She states that she has a stinging sensation when she urinates, but it is only on the external genitalia, and she does not associate the symptoms with a urinary tract infection, but rather with irritation of the external genitalia. She denies any abdominal pain or back pain.  The history is provided by the patient. No language interpreter was used.    Past Medical History  Diagnosis Date  . Thyroid disease   . Hypertension   . Anemia   . Asthma   . GERD (gastroesophageal reflux disease)   . Diabetes mellitus without complication   . Headache(784.0)   . Gallstones   . Bleeding disorder     "UNKNOWN PROBLEM" has not seen "blood doctor "yet   Past Surgical History  Procedure Laterality Date  . Cholecystectomy N/A 09/26/2013    Procedure: LAPAROSCOPIC CHOLECYSTECTOMY WITH INTRAOPERATIVE CHOLANGIOGRAM;  Surgeon: Earnstine Regal, MD;  Location: [facility];  Service: General;  Laterality: N/A;   Family History  Problem Relation Age of Onset  . Cancer Mother     Pancreatic Cancer   History  Substance Use Topics  . Smoking status: Current Every Day Smoker  . Smokeless tobacco: Not on file  . Alcohol Use: No     Comment: occ   OB History    No data available     Review of Systems  Constitutional: Negative for fever and chills.  Respiratory: Negative for shortness of breath.   Cardiovascular: Negative for  chest pain.  Gastrointestinal: Negative for nausea, vomiting, diarrhea and constipation.  Genitourinary: Positive for dysuria and vaginal discharge.  All other systems reviewed and are negative.     Allergies  Review of patient's allergies indicates no known allergies.  Home Medications   Prior to Admission medications   Medication Sig Start Date End Date Taking? Authorizing Provider  albuterol (PROVENTIL HFA;VENTOLIN HFA) 108 (90 BASE) MCG/ACT inhaler Inhale 2 puffs into the lungs every 4 (four) hours as needed for wheezing or shortness of breath.   Yes Historical Provider, MD  calcium carbonate (TUMS - DOSED IN MG ELEMENTAL CALCIUM) 500 MG chewable tablet Chew 1 tablet by mouth daily.   Yes Historical Provider, MD  canagliflozin (INVOKANA) 100 MG TABS tablet Take 100 mg by mouth daily.   Yes Historical Provider, MD  cetirizine (ZYRTEC ALLERGY) 10 MG tablet Take 1 tablet (10 mg total) by mouth daily. 08/18/14  Yes [name], PA-C  Fe Fum-Fe Poly-Vit C-Lactobac (FUSION) 65-65-25-30 MG CAPS Take 1 tablet by mouth daily.   Yes Historical Provider, MD  fluconazole (DIFLUCAN) 150 MG tablet Take 150 mg by mouth every 7 (seven) days.   Yes Historical Provider, MD  gabapentin (NEURONTIN) 300 MG capsule Take 300 mg by mouth 2 (two) times daily.   Yes Historical Provider, MD  ketorolac (ACULAR) 0.5 % ophthalmic solution Place 1 drop into the left eye 4 (four) times daily. 08/18/14  Yes [name], PA-C  lisinopril-hydrochlorothiazide (PRINZIDE,ZESTORETIC) 20-12.5 MG  per tablet Take 1 tablet by mouth every morning.    Yes Historical Provider, MD  metFORMIN (GLUCOPHAGE) 500 MG tablet Take 500 mg by mouth 2 (two) times daily with a meal.   Yes Historical Provider, MD  pantoprazole (PROTONIX) 40 MG tablet Take 40 mg by mouth daily.   Yes Historical Provider, MD  traMADol (ULTRAM) 50 MG tablet Take 1 tablet (50 mg total) by mouth every 6 (six) hours as needed. 04/28/14  Yes Robyn M Hess, PA-C   diazepam (VALIUM) 5 MG tablet Take 1 tablet (5 mg total) by mouth every 12 (twelve) hours as needed for muscle spasms. Patient not taking: Reported on 08/29/2014 04/28/14   Carman Ching, PA-C  HYDROcodone-acetaminophen (NORCO/VICODIN) 5-325 MG per tablet Take 1-2 tablets by mouth every 4 (four) hours as needed for moderate pain. Patient not taking: Reported on 08/29/2014 09/26/13   Armandina Gemma, MD  meloxicam (MOBIC) 7.5 MG tablet Take 1 tablet (7.5 mg total) by mouth 2 (two) times daily. Patient not taking: Reported on 08/29/2014 04/28/14   Carman Ching, PA-C  naproxen (NAPROSYN) 500 MG tablet Take 1 tablet (500 mg total) by mouth 2 (two) times daily. Patient not taking: Reported on 08/29/2014 03/30/14   Comer Locket, PA-C  promethazine (PHENERGAN) 25 MG tablet Take 1 tablet (25 mg total) by mouth every 6 (six) hours as needed for nausea or vomiting. Patient not taking: Reported on 08/29/2014 09/26/13   Armandina Gemma, MD   BP 129/79 mmHg  Pulse 98  Temp(Src) 97.7 F (36.5 C) (Oral)  Resp 18  SpO2 97%  LMP 07/29/2014 Physical Exam  Constitutional: She is oriented to person, place, and time. She appears well-developed and well-nourished.  HENT:  Head: Normocephalic and atraumatic.  Eyes: Conjunctivae and EOM are normal. Pupils are equal, round, and reactive to light.  Neck: Normal range of motion. Neck supple.  Cardiovascular: Normal rate and regular rhythm.  Exam reveals no gallop and no friction rub.   No murmur heard. Pulmonary/Chest: Effort normal and breath sounds normal. No respiratory distress. She has no wheezes. She has no rales. She exhibits no tenderness.  Abdominal: Soft. Bowel sounds are normal. She exhibits no distension and no mass. There is no tenderness. There is no rebound and no guarding.  Genitourinary:  Pelvic exam chaperoned by female ER tech, no right or left adnexal tenderness, no uterine tenderness, moderate vaginal discharge, no bleeding, no CMT or friability, no  foreign body, mild ulcerations to external genitalia, no other significant findings   Musculoskeletal: Normal range of motion. She exhibits no edema or tenderness.  Neurological: She is alert and oriented to person, place, and time.  Skin: Skin is warm and dry.  Psychiatric: She has a normal mood and affect. Her behavior is normal. Judgment and thought content normal.  Nursing note and vitals reviewed.   ED Course  Procedures (including critical care time) Results for orders placed or performed during the hospital encounter of 08/29/14  Wet prep, genital  Result Value Ref Range   Yeast Wet Prep HPF POC NONE SEEN NONE SEEN   Trich, Wet Prep NONE SEEN NONE SEEN   Clue Cells Wet Prep HPF POC NONE SEEN NONE SEEN   WBC, Wet Prep HPF POC FEW (A) NONE SEEN  Urinalysis, Routine w reflex microscopic-may I&O cath if menses (not at [facility])  Result Value Ref Range   Color, Urine YELLOW YELLOW   APPearance CLEAR CLEAR   Specific Gravity, Urine 1.036 (H) 1.005 -  1.030   pH 5.0 5.0 - 8.0   Glucose, UA >1000 (A) NEGATIVE mg/dL   Hgb urine dipstick TRACE (A) NEGATIVE   Bilirubin Urine NEGATIVE NEGATIVE   Ketones, ur NEGATIVE NEGATIVE mg/dL   Protein, ur NEGATIVE NEGATIVE mg/dL   Urobilinogen, UA 0.2 0.0 - 1.0 mg/dL   Nitrite NEGATIVE NEGATIVE   Leukocytes, UA NEGATIVE NEGATIVE  Urine microscopic-add on  Result Value Ref Range   Squamous Epithelial / LPF FEW (A) RARE   WBC, UA 0-2 <3 WBC/hpf   RBC / HPF 0-2 <3 RBC/hpf   Urine-Other FEW YEAST   I-stat chem 8, ed  Result Value Ref Range   Sodium 138 135 - 145 mmol/L   Potassium 3.7 3.5 - 5.1 mmol/L   Chloride 101 101 - 111 mmol/L   BUN 14 6 - 20 mg/dL   Creatinine, Ser 0.80 0.44 - 1.00 mg/dL   Glucose, Bld 100 (H) 65 - 99 mg/dL   Calcium, Ion 1.20 1.12 - 1.23 mmol/L   TCO2 21 0 - 100 mmol/L   Hemoglobin 12.6 12.0 - 15.0 g/dL   HCT 37.0 36.0 - 46.0 %   No results found.    EKG Interpretation None      MDM   Final diagnoses:   Genital ulcer, female    Patient with dysuria, being treated for yeast infection with diflucan.    Labs and pelvic exam unremarkable.  Patient has a few small ulceration of the external genitalia, but it doesn't look like herpes.  Uncertain of etiology.  Will refer to GYN as workup is largely unremarkable in ED.    Montine Circle, PA-C 08/29/14 [place], MD 08/29/14 2252

## 2014-09-01 LAB — URINE CULTURE

## 2014-09-02 ENCOUNTER — Telehealth (HOSPITAL_COMMUNITY): Payer: Self-pay

## 2014-09-02 NOTE — Telephone Encounter (Signed)
Post ED Visit - Positive Culture Follow-up  Culture report reviewed by antimicrobial stewardship pharmacist: []  Wes Dulaney, Pharm.D., BCPS []  Heide Guile, Pharm.D., BCPS []  Alycia Rossetti, Pharm.D., BCPS []  [place], Pharm.D., BCPS, AAHIVP []  Legrand Como, Pharm.D., BCPS, AAHIVP []  Isac Sarna, Pharm.D., BCPS [facility]  Positive urine culture Per Caryl Ada PA no further patient follow-up is required at this time.  Ileene Musa 09/02/2014, 8:31 AM

## 2014-09-02 NOTE — Progress Notes (Signed)
ED Antimicrobial Stewardship Positive Culture Follow Up   Crystal Brewer is an 39 y.o. female who presented to [facility] on 08/29/2014 with a chief complaint of  Chief Complaint  Patient presents with  . Dysuria    Recent Results (from the past 720 hour(s))  Urine culture     Status: None   Collection Time: 08/29/14  5:30 PM  Result Value Ref Range Status   Specimen Description URINE, RANDOM  Final   Special Requests NONE  Final   Culture   Final    10,000 COLONIES/mL ENTEROCOCCUS SPECIES Performed at [facility]    Report Status 09/01/2014 FINAL  Final   Organism ID, Bacteria ENTEROCOCCUS SPECIES  Final      Susceptibility   Enterococcus species - MIC*    AMPICILLIN <=2 SENSITIVE Sensitive     LEVOFLOXACIN 0.5 SENSITIVE Sensitive     NITROFURANTOIN <=16 SENSITIVE Sensitive     VANCOMYCIN 1 SENSITIVE Sensitive     LINEZOLID 2 SENSITIVE Sensitive     * 10,000 COLONIES/mL ENTEROCOCCUS SPECIES  Wet prep, genital     Status: Abnormal   Collection Time: 08/29/14  6:55 PM  Result Value Ref Range Status   Yeast Wet Prep HPF POC NONE SEEN NONE SEEN Final   Trich, Wet Prep NONE SEEN NONE SEEN Final   Clue Cells Wet Prep HPF POC NONE SEEN NONE SEEN Final   WBC, Wet Prep HPF POC FEW (A) NONE SEEN Final   Pt had urine culture with 10,000 Enterococcus species there is no indication to treat with antibiotics.  ED Provider: Caryl Ada, PA-C   [name] 09/02/2014, 8:19 AM Infectious Diseases Pharmacist Phone# [phone]

## 2014-11-06 ENCOUNTER — Encounter (HOSPITAL_COMMUNITY): Payer: Self-pay | Admitting: Emergency Medicine

## 2014-11-06 ENCOUNTER — Emergency Department (HOSPITAL_COMMUNITY)
Admission: EM | Admit: 2014-11-06 | Discharge: 2014-11-07 | Disposition: A | Discharge status: Home / Self Care | Payer: Medicaid Other | Attending: Emergency Medicine | Admitting: Emergency Medicine

## 2014-11-06 DIAGNOSIS — K219 Gastro-esophageal reflux disease without esophagitis: Secondary | ICD-10-CM | POA: Insufficient documentation

## 2014-11-06 DIAGNOSIS — Z79899 Other long term (current) drug therapy: Secondary | ICD-10-CM | POA: Diagnosis not present

## 2014-11-06 DIAGNOSIS — Z791 Long term (current) use of non-steroidal anti-inflammatories (NSAID): Secondary | ICD-10-CM | POA: Insufficient documentation

## 2014-11-06 DIAGNOSIS — R102 Pelvic and perineal pain: Secondary | ICD-10-CM | POA: Diagnosis not present

## 2014-11-06 DIAGNOSIS — N898 Other specified noninflammatory disorders of vagina: Secondary | ICD-10-CM | POA: Diagnosis not present

## 2014-11-06 DIAGNOSIS — E119 Type 2 diabetes mellitus without complications: Secondary | ICD-10-CM | POA: Diagnosis not present

## 2014-11-06 DIAGNOSIS — I10 Essential (primary) hypertension: Secondary | ICD-10-CM | POA: Diagnosis not present

## 2014-11-06 DIAGNOSIS — J45909 Unspecified asthma, uncomplicated: Secondary | ICD-10-CM | POA: Diagnosis not present

## 2014-11-06 DIAGNOSIS — D649 Anemia, unspecified: Secondary | ICD-10-CM | POA: Insufficient documentation

## 2014-11-06 DIAGNOSIS — Z720 Tobacco use: Secondary | ICD-10-CM | POA: Diagnosis not present

## 2014-11-06 DIAGNOSIS — Z8739 Personal history of other diseases of the musculoskeletal system and connective tissue: Secondary | ICD-10-CM | POA: Insufficient documentation

## 2014-11-06 HISTORY — DX: Personal history of other diseases of the musculoskeletal system and connective tissue: Z87.39

## 2014-11-06 NOTE — ED Notes (Signed)
Bed: WTR7 Expected date:  Expected time:  Means of arrival:  Comments: 

## 2014-11-06 NOTE — ED Provider Notes (Signed)
CSN: [id number]     Arrival date & time 11/06/14  1824 History   First MD Initiated Contact with Patient 11/06/14 2309     Chief Complaint  Patient presents with  . Vaginal Itching     (Consider location/radiation/quality/duration/timing/severity/associated sxs/prior Treatment) HPI Comments: Patient complains of vaginal itching that started several days ago. After scratching, she now reports soreness and pain. She denies vaginal discharge, dysuria. No blisters, significant swelling or pelvic pain. She has been given "a pill to take once a week" by her PCP for similar symptoms, she thinks this is for yeast infections. She has not tried taking the pill given to her. No fever.   The history is provided by the patient. No language interpreter was used.    Past Medical History  Diagnosis Date  . Thyroid disease   . Hypertension   . Anemia   . Asthma   . GERD (gastroesophageal reflux disease)   . Diabetes mellitus without complication ([place])   . Headache(784.0)   . Gallstones   . Bleeding disorder ([place])     "UNKNOWN PROBLEM" has not seen "blood doctor "yet  . Hx of degenerative disc disease    Past Surgical History  Procedure Laterality Date  . Cholecystectomy N/A 09/26/2013    Procedure: LAPAROSCOPIC CHOLECYSTECTOMY WITH INTRAOPERATIVE CHOLANGIOGRAM;  Surgeon: Earnstine Regal, MD;  Location: [facility];  Service: General;  Laterality: N/A;   Family History  Problem Relation Age of Onset  . Cancer Mother     Pancreatic Cancer   Social History  Substance Use Topics  . Smoking status: Current Every Day Smoker  . Smokeless tobacco: None  . Alcohol Use: No     Comment: occ   OB History    No data available     Review of Systems  Constitutional: Negative for fever and chills.  Gastrointestinal: Negative.  Negative for vomiting and abdominal pain.  Genitourinary: Positive for vaginal pain. Negative for dysuria, vaginal discharge and pelvic pain.  Musculoskeletal: Negative.   Skin:  Negative.   Neurological: Negative.       Allergies  Review of patient's allergies indicates no known allergies.  Home Medications   Prior to Admission medications   Medication Sig Start Date End Date Taking? Authorizing Provider  albuterol (PROVENTIL HFA;VENTOLIN HFA) 108 (90 BASE) MCG/ACT inhaler Inhale 2 puffs into the lungs every 4 (four) hours as needed for wheezing or shortness of breath.    Historical Provider, MD  calcium carbonate (TUMS - DOSED IN MG ELEMENTAL CALCIUM) 500 MG chewable tablet Chew 1 tablet by mouth daily.    Historical Provider, MD  canagliflozin (INVOKANA) 100 MG TABS tablet Take 100 mg by mouth daily.    Historical Provider, MD  cetirizine (ZYRTEC ALLERGY) 10 MG tablet Take 1 tablet (10 mg total) by mouth daily. 08/18/14   [name], PA-C  diazepam (VALIUM) 5 MG tablet Take 1 tablet (5 mg total) by mouth every 12 (twelve) hours as needed for muscle spasms. Patient not taking: Reported on 08/29/2014 04/28/14   Carman Ching, PA-C  Fe Fum-Fe Poly-Vit C-Lactobac (FUSION) 65-65-25-30 MG CAPS Take 1 tablet by mouth daily.    Historical Provider, MD  fluconazole (DIFLUCAN) 150 MG tablet Take 150 mg by mouth every 7 (seven) days.    Historical Provider, MD  gabapentin (NEURONTIN) 300 MG capsule Take 300 mg by mouth 2 (two) times daily.    Historical Provider, MD  HYDROcodone-acetaminophen (NORCO/VICODIN) 5-325 MG per tablet Take 1-2 tablets by  mouth every 4 (four) hours as needed for moderate pain. Patient not taking: Reported on 08/29/2014 09/26/13   Armandina Gemma, MD  ibuprofen (ADVIL,MOTRIN) 800 MG tablet Take 1 tablet (800 mg total) by mouth 3 (three) times daily. 08/29/14   Montine Circle, PA-C  ketorolac (ACULAR) 0.5 % ophthalmic solution Place 1 drop into the left eye 4 (four) times daily. 08/18/14   [name], PA-C  lisinopril-hydrochlorothiazide (PRINZIDE,ZESTORETIC) 20-12.5 MG per tablet Take 1 tablet by mouth every morning.     Historical Provider, MD   meloxicam (MOBIC) 7.5 MG tablet Take 1 tablet (7.5 mg total) by mouth 2 (two) times daily. Patient not taking: Reported on 08/29/2014 04/28/14   Carman Ching, PA-C  metFORMIN (GLUCOPHAGE) 500 MG tablet Take 500 mg by mouth 2 (two) times daily with a meal.    Historical Provider, MD  naproxen (NAPROSYN) 500 MG tablet Take 1 tablet (500 mg total) by mouth 2 (two) times daily. Patient not taking: Reported on 08/29/2014 03/30/14   Comer Locket, PA-C  pantoprazole (PROTONIX) 40 MG tablet Take 40 mg by mouth daily.    Historical Provider, MD  promethazine (PHENERGAN) 25 MG tablet Take 1 tablet (25 mg total) by mouth every 6 (six) hours as needed for nausea or vomiting. Patient not taking: Reported on 08/29/2014 09/26/13   Armandina Gemma, MD  traMADol (ULTRAM) 50 MG tablet Take 1 tablet (50 mg total) by mouth every 6 (six) hours as needed. 04/28/14   Robyn M Hess, PA-C   BP 150/84 mmHg  Pulse 92  Temp(Src) 97.9 F (36.6 C) (Oral)  Resp 16  SpO2 100% Physical Exam  Constitutional: She is oriented to person, place, and time. She appears well-developed and well-nourished.  Neck: Normal range of motion.  Pulmonary/Chest: Effort normal.  Genitourinary:  External right labia has superficial ulcerations without erythema or blisters. No discrete abscess. Vulva unremarkable without swelling or ulceration. Minimal thin, grayish vaginal discharge. Cervix non-tender and without friability.  Neurological: She is alert and oriented to person, place, and time.  Skin: Skin is warm and dry.  Psychiatric: She has a normal mood and affect.    ED Course  Procedures (including critical care time) Labs Review Labs Reviewed  WET PREP, GENITAL  GC/CHLAMYDIA PROBE AMP ([place]) NOT AT [facility]   Results for orders placed or performed during the hospital encounter of 11/06/14  Wet prep, genital  Result Value Ref Range   Yeast Wet Prep HPF POC NONE SEEN NONE SEEN   Trich, Wet Prep NONE SEEN NONE SEEN   Clue Cells  Wet Prep HPF POC FEW (A) NONE SEEN   WBC, Wet Prep HPF POC MODERATE (A) NONE SEEN  GC/Chlamydia probe amp ([place])not at [facility]  Result Value Ref Range   Chlamydia Negative    Neisseria gonorrhea Negative     Imaging Review No results found. I have personally reviewed and evaluated these images and lab results as part of my medical decision-making.   EKG Interpretation None      MDM   Final diagnoses:  None    1. Vaginal itching.  No evidence of PID. Symptoms c/w yeast even with negative wet prep. Will provide symptomatic treatment (Vagisil, OTC). GYN follow up provided.    Charlann Lange, PA-C 11/12/14 2025  April Palumbo, MD 11/16/14 [phone]

## 2014-11-06 NOTE — ED Notes (Addendum)
Pt reports vaginal itching and having a "pimple" in her vaginal area. No vaginal discharge. Denies dysuria

## 2014-11-06 NOTE — ED Notes (Signed)
Bed: WTR6 Expected date:  Expected time:  Means of arrival:  Comments: 

## 2014-11-07 LAB — WET PREP, GENITAL
Trich, Wet Prep: NONE SEEN
Yeast Wet Prep HPF POC: NONE SEEN

## 2014-11-07 NOTE — Discharge Instructions (Signed)
FOLLOW UP WITH [facility] FOR FURTHER EVALUATION OF PERSISTENT RECURRENT VAGINAL ITCHING AND SORENESS. RECOMMEND VAGISIL FOR RELIEF OF PAIN AND ITCHING UNTIL SEEN BY GYNECOLOGY.

## 2014-11-09 LAB — GC/CHLAMYDIA PROBE AMP (~~LOC~~) NOT AT ARMC
CHLAMYDIA, DNA PROBE: NEGATIVE
Neisseria Gonorrhea: NEGATIVE

## 2014-11-27 ENCOUNTER — Encounter: Payer: Medicaid Other | Admitting: Student

## 2015-01-15 ENCOUNTER — Emergency Department (HOSPITAL_COMMUNITY)
Admission: EM | Admit: 2015-01-15 | Discharge: 2015-01-15 | Disposition: A | Discharge status: Home / Self Care | Payer: Medicaid Other | Attending: Emergency Medicine | Admitting: Emergency Medicine

## 2015-01-15 ENCOUNTER — Encounter (HOSPITAL_COMMUNITY): Payer: Self-pay | Admitting: Emergency Medicine

## 2015-01-15 DIAGNOSIS — B001 Herpesviral vesicular dermatitis: Secondary | ICD-10-CM

## 2015-01-15 DIAGNOSIS — Z3202 Encounter for pregnancy test, result negative: Secondary | ICD-10-CM | POA: Diagnosis not present

## 2015-01-15 DIAGNOSIS — F17200 Nicotine dependence, unspecified, uncomplicated: Secondary | ICD-10-CM | POA: Insufficient documentation

## 2015-01-15 DIAGNOSIS — Z7984 Long term (current) use of oral hypoglycemic drugs: Secondary | ICD-10-CM | POA: Insufficient documentation

## 2015-01-15 DIAGNOSIS — J45909 Unspecified asthma, uncomplicated: Secondary | ICD-10-CM | POA: Insufficient documentation

## 2015-01-15 DIAGNOSIS — E119 Type 2 diabetes mellitus without complications: Secondary | ICD-10-CM | POA: Insufficient documentation

## 2015-01-15 DIAGNOSIS — I10 Essential (primary) hypertension: Secondary | ICD-10-CM | POA: Diagnosis not present

## 2015-01-15 DIAGNOSIS — K219 Gastro-esophageal reflux disease without esophagitis: Secondary | ICD-10-CM | POA: Insufficient documentation

## 2015-01-15 DIAGNOSIS — D649 Anemia, unspecified: Secondary | ICD-10-CM | POA: Diagnosis not present

## 2015-01-15 DIAGNOSIS — Z79899 Other long term (current) drug therapy: Secondary | ICD-10-CM | POA: Insufficient documentation

## 2015-01-15 DIAGNOSIS — M79605 Pain in left leg: Secondary | ICD-10-CM | POA: Diagnosis present

## 2015-01-15 DIAGNOSIS — R252 Cramp and spasm: Secondary | ICD-10-CM

## 2015-01-15 LAB — POC URINE PREG, ED: Preg Test, Ur: NEGATIVE

## 2015-01-15 MED ORDER — IBUPROFEN 800 MG PO TABS
800.0000 mg | ORAL_TABLET | Freq: Once | ORAL | Status: AC
  Administered 2015-01-15: 800 mg via ORAL
  Filled 2015-01-15: qty 1

## 2015-01-15 NOTE — ED Notes (Signed)
Pt c/o 9/10 mouth pain and 8/10 left shoulder pain.  She is currently lying on her left side with her left arm underneath her body; no signs of acute distress noted.  PA notified of pt's request.

## 2015-01-15 NOTE — ED Notes (Addendum)
Pt reports lip burning/blisters, mouth burning, bilateral leg pain, hair falling out (pt has baseball size clump of hair with her that fell out today.) Takes Vicodin for pain and also reports her sister was diagnosed with Lupus-is afraid she has the same because s/s are very similar. Denies N/V/D/F. Hx anemia, DM, HTN. Also is unsure if she is pregnant.

## 2015-01-15 NOTE — Discharge Instructions (Signed)
Cold Sore A cold sore (fever blister) is a skin infection caused by a certain type of germ (virus). They are small sores filled with fluid that dry up and heal within 2 weeks. Cold sores form inside of the mouth or on the lips, gums, and other parts of the body. Cold sores can be easily passed (contagious) to other people. This can happen through close personal contact, such as kissing or sharing a drinking glass. HOME CARE  Only take medicine as told by your doctor. Do not use aspirin.  Use a cotton-tip swab to put creams or gels on your sores.  Do not touch sores or pick scabs. Wash your hands often. Do not touch your eyes without washing your hands first.  Avoid kissing, oral sex, and sharing personal items until the sores heal.  Put an ice pack on your sores for 10-15 minutes to ease discomfort.  Avoid hot, cold, or salty foods. Eat a soft, bland diet. Use a straw to drink if it helps lessen pain.  Keep sores clean and dry.  Avoid the sun and limit stress if these things cause you to have sores. Apply sunscreen on your lips if the sun causes cold sores. GET HELP IF:  You have a fever or lasting symptoms for more than 2-3 days.  You have a fever and your symptoms suddenly get worse.  You have yellow-white fluid (not clear) coming from the sores.  You have redness that is spreading.  You have pain or irritation in your eye.  You get sores on your genitals.  Your sores do not heal within 2 weeks.  You have a tough time fighting off sickness and infections (weakened immune system).  You get cold sores often. MAKE SURE YOU:   Understand these instructions.  Will watch your condition.  Will get help right away if you are not doing well or get worse.   This information is not intended to replace advice given to you by your health care provider. Make sure you discuss any questions you have with your health care provider.   Document Released: 07/18/2011 Document Reviewed:  07/18/2011 Elsevier Interactive Patient Education 2016 [employer]  Muscle Cramps and Spasms Muscle cramps and spasms occur when a muscle or muscles tighten and you have no control over this tightening (involuntary muscle contraction). They are a common problem and can develop in any muscle. The most common place is in the calf muscles of the leg. Both muscle cramps and muscle spasms are involuntary muscle contractions, but they also have differences:   Muscle cramps are sporadic and painful. They may last a few seconds to a quarter of an hour. Muscle cramps are often more forceful and last longer than muscle spasms.  Muscle spasms may or may not be painful. They may also last just a few seconds or much longer. CAUSES  It is uncommon for cramps or spasms to be due to a serious underlying problem. In many cases, the cause of cramps or spasms is unknown. Some common causes are:   Overexertion.   Overuse from repetitive motions (doing the same thing over and over).   Remaining in a certain position for a long period of time.   Improper preparation, form, or technique while performing a sport or activity.   Dehydration.   Injury.   Side effects of some medicines.   Abnormally low levels of the salts and ions in your blood (electrolytes), especially potassium and calcium. This could happen if you  are taking water pills (diuretics) or you are pregnant.  Some underlying medical problems can make it more likely to develop cramps or spasms. These include, but are not limited to:   Diabetes.   Parkinson disease.   Hormone disorders, such as thyroid problems.   Alcohol abuse.   Diseases specific to muscles, joints, and bones.   Blood vessel disease where not enough blood is getting to the muscles.  HOME CARE INSTRUCTIONS   Stay well hydrated. Drink enough water and fluids to keep your urine clear or pale yellow.  It may be helpful to massage, stretch, and relax the  affected muscle.  For tight or tense muscles, use a warm towel, heating pad, or hot shower water directed to the affected area.  If you are sore or have pain after a cramp or spasm, applying ice to the affected area may relieve discomfort.  Put ice in a plastic bag.  Place a towel between your skin and the bag.  Leave the ice on for 15-20 minutes, 03-04 times a day.  Medicines used to treat a known cause of cramps or spasms may help reduce their frequency or severity. Only take over-the-counter or prescription medicines as directed by your caregiver. SEEK MEDICAL CARE IF:  Your cramps or spasms get more severe, more frequent, or do not improve over time.  MAKE SURE YOU:   Understand these instructions.  Will watch your condition.  Will get help right away if you are not doing well or get worse.   This information is not intended to replace advice given to you by your health care provider. Make sure you discuss any questions you have with your health care provider.   Follow up with your primary care provider for further evaluation. Recommend petroleum jelly and lubrication for dry, chapped lips. May take ibuprofen as needed for muscle pain. Return to the Ed if you experience chest pain, difficulty breathing, fever, vomiting.

## 2015-01-16 NOTE — ED Provider Notes (Signed)
CSN: [id number]     Arrival date & time 01/15/15  1750 History   First MD Initiated Contact with Patient 01/15/15 1930     Chief Complaint  Patient presents with  . Blister    lips  . Leg Pain    bilateral  . Lupus?      (Consider location/radiation/quality/duration/timing/severity/associated sxs/prior Treatment) HPI   Crystal Brewer is a 39 y.o F with a pmhx of hyopthyroidism, DM, GERD, HTN who presents to the ED with multiple complaints. Patient states that over the last 2-3 days she has had soreness in her left upper extremity and left thigh. No trauma or injury. No paresthesias or weakness. Patient also states that her upper lip is very cracked and dried and feels like it has a burning sensation in it. This is been present for one week on and off. No sores are present. No associated fevers or chills. Patient also states that today she was combing her hair when a large clump of hair fell out. Patient states that her sister was diagnosed with lupus about 5 months ago and she is concerned that she might have it as well. Patient like to be tested for that today. Patient also states that she might be pregnant and would like a pregnancy test today. Denies dysuria, hematuria, vaginal bleeding, vaginal discharge, abdominal pain, headache, rash.  Past Medical History  Diagnosis Date  . Thyroid disease   . Hypertension   . Anemia   . Asthma   . GERD (gastroesophageal reflux disease)   . Diabetes mellitus without complication ([place])   . Headache(784.0)   . Gallstones   . Bleeding disorder ([place])     "UNKNOWN PROBLEM" has not seen "blood doctor "yet  . Hx of degenerative disc disease    Past Surgical History  Procedure Laterality Date  . Cholecystectomy N/A 09/26/2013    Procedure: LAPAROSCOPIC CHOLECYSTECTOMY WITH INTRAOPERATIVE CHOLANGIOGRAM;  Surgeon: Earnstine Regal, MD;  Location: [facility];  Service: General;  Laterality: N/A;   Family History  Problem Relation Age of Onset  . Cancer  Mother     Pancreatic Cancer   Social History  Substance Use Topics  . Smoking status: Current Every Day Smoker  . Smokeless tobacco: None  . Alcohol Use: No     Comment: occ   OB History    No data available     Review of Systems  All other systems reviewed and are negative.     Allergies  Review of patient's allergies indicates no known allergies.  Home Medications   Prior to Admission medications   Medication Sig Start Date End Date Taking? Authorizing Provider  albuterol (PROVENTIL HFA;VENTOLIN HFA) 108 (90 BASE) MCG/ACT inhaler Inhale 2 puffs into the lungs every 4 (four) hours as needed for wheezing or shortness of breath.   Yes Historical Provider, MD  canagliflozin (INVOKANA) 100 MG TABS tablet Take 100 mg by mouth daily.   Yes Historical Provider, MD  cetirizine (ZYRTEC ALLERGY) 10 MG tablet Take 1 tablet (10 mg total) by mouth daily. 08/18/14  Yes [name], PA-C  Fe Fum-Fe Poly-Vit C-Lactobac (FUSION) 65-65-25-30 MG CAPS Take 1 tablet by mouth daily.   Yes Historical Provider, MD  lisinopril-hydrochlorothiazide (PRINZIDE,ZESTORETIC) 20-12.5 MG per tablet Take 1 tablet by mouth every morning.    Yes Historical Provider, MD  metFORMIN (GLUCOPHAGE) 500 MG tablet Take 500 mg by mouth 2 (two) times daily with a meal.   Yes Historical Provider, MD  pantoprazole (PROTONIX) 40  MG tablet Take 40 mg by mouth daily.   Yes Historical Provider, MD  calcium carbonate (TUMS - DOSED IN MG ELEMENTAL CALCIUM) 500 MG chewable tablet Chew 1 tablet by mouth 2 (two) times daily as needed for indigestion or heartburn.     Historical Provider, MD  diazepam (VALIUM) 5 MG tablet Take 1 tablet (5 mg total) by mouth every 12 (twelve) hours as needed for muscle spasms. Patient not taking: Reported on 08/29/2014 04/28/14   Carman Ching, PA-C  gabapentin (NEURONTIN) 300 MG capsule Take 300 mg by mouth 2 (two) times daily as needed (nerve pain).     Historical Provider, MD   HYDROcodone-acetaminophen (NORCO/VICODIN) 5-325 MG per tablet Take 1-2 tablets by mouth every 4 (four) hours as needed for moderate pain. Patient not taking: Reported on 08/29/2014 09/26/13   Armandina Gemma, MD  ibuprofen (ADVIL,MOTRIN) 800 MG tablet Take 1 tablet (800 mg total) by mouth 3 (three) times daily. Patient not taking: Reported on 01/15/2015 08/29/14   Montine Circle, PA-C  ketorolac (ACULAR) 0.5 % ophthalmic solution Place 1 drop into the left eye 4 (four) times daily. Patient not taking: Reported on 01/15/2015 08/18/14   [name], PA-C  meloxicam (MOBIC) 7.5 MG tablet Take 1 tablet (7.5 mg total) by mouth 2 (two) times daily. Patient not taking: Reported on 08/29/2014 04/28/14   Carman Ching, PA-C  naproxen (NAPROSYN) 500 MG tablet Take 1 tablet (500 mg total) by mouth 2 (two) times daily. Patient not taking: Reported on 08/29/2014 03/30/14   Comer Locket, PA-C  promethazine (PHENERGAN) 25 MG tablet Take 1 tablet (25 mg total) by mouth every 6 (six) hours as needed for nausea or vomiting. Patient not taking: Reported on 08/29/2014 09/26/13   Armandina Gemma, MD  traMADol (ULTRAM) 50 MG tablet Take 1 tablet (50 mg total) by mouth every 6 (six) hours as needed. Patient not taking: Reported on 01/15/2015 04/28/14   Hessie Diener Hess, PA-C   BP 157/89 mmHg  Pulse 78  Temp(Src) 98.1 F (36.7 C) (Oral)  Resp 18  SpO2 100%  LMP 12/08/2014 (Approximate) Physical Exam  Constitutional: She is oriented to person, place, and time. She appears well-developed and well-nourished. No distress.  HENT:  Head: Normocephalic and atraumatic.  Mouth/Throat: No oropharyngeal exudate.  Dry, cracked upper lip. No obvious sores or viral outbreak.   Eyes: Conjunctivae and EOM are normal. Pupils are equal, round, and reactive to light. Right eye exhibits no discharge. Left eye exhibits no discharge. No scleral icterus.  Neck: Neck supple.  Cardiovascular: Normal rate, regular rhythm, normal heart sounds and  intact distal pulses.  Exam reveals no gallop and no friction rub.   No murmur heard. Pulmonary/Chest: Effort normal and breath sounds normal. No respiratory distress. She has no wheezes. She has no rales. She exhibits no tenderness.  Abdominal: Soft. She exhibits no distension. There is no tenderness. There is no guarding.  Musculoskeletal: Normal range of motion. She exhibits no edema.  FROM of bilateral upper and lower extremities.  No muscular TTP. No obvious bony deformity. No edema, ecchymosis or erythema. Negative hawkins test, negative Neer's test, no TTP over shoulder or elbow. No pain with flexion/extension/abduction/adduction internal or external rotation.      Lymphadenopathy:    She has no cervical adenopathy.  Neurological: She is alert and oriented to person, place, and time.  Strength 5/5 throughout. No sensory deficits.  No gait abnormality.  Skin: Skin is warm and dry. No rash noted. She  is not diaphoretic. No erythema. No pallor.  Psychiatric: She has a normal mood and affect. Her behavior is normal.  Nursing note and vitals reviewed.   ED Course  Procedures (including critical care time) Labs Review Labs Reviewed  POC URINE PREG, ED    Imaging Review No results found. I have personally reviewed and evaluated these images and lab results as part of my medical decision-making.   EKG Interpretation None      MDM   Final diagnoses:  Muscle cramps  Cold sore    Patient with multiple complaints in the emergency department today including vague muscle soreness, dry cracked lips, loss of hair earlier today. Patient also concerned that she may be pregnant. Patient mainly concerned that she may have lupus because her sister recently got diagnosed 5 months ago and she is having similar symptoms. Patient appears well in the emergency department, nontoxic and nonseptic appearing. On clinical exam, patient has full range of motion of bilateral upper and lower  extremities. No trauma or injury. No tenderness or pain on palpation. Recommend ibuprofen for this muscle soreness. No obvious sores or rash to lips. They appear dry and cracked. This is most likely due to the weather change. Recommend patient lubricate with Chapstick or petroleum jelly to maintain moisture and avoid cracking. Patient appears to have full head of hair in the emergency department. No distinct loss of hair alopecia. No sign of tinea capita. Pregnancy test negative. No advanced imaging or lab work indicated at this time. Discussed with patient that she would need to be worked up as an outpatient for lupus and this was of main concern to her. This would be up to her primary care provider to order lab work as an outpatient. Patient is agreeable to this plan. Patient is hemodynamically stable and ready for discharge. Return precautions outlined in patient discharge instructions.    Dondra Spry [place], PA-C 01/16/15 2118  Daleen Bo, MD 01/16/15 [phone]

## 2015-04-19 ENCOUNTER — Emergency Department (HOSPITAL_COMMUNITY)
Admission: EM | Admit: 2015-04-19 | Discharge: 2015-04-19 | Disposition: A | Discharge status: Home / Self Care | Payer: Medicaid Other | Attending: Emergency Medicine | Admitting: Emergency Medicine

## 2015-04-19 DIAGNOSIS — Z79899 Other long term (current) drug therapy: Secondary | ICD-10-CM | POA: Insufficient documentation

## 2015-04-19 DIAGNOSIS — E119 Type 2 diabetes mellitus without complications: Secondary | ICD-10-CM | POA: Diagnosis not present

## 2015-04-19 DIAGNOSIS — S161XXA Strain of muscle, fascia and tendon at neck level, initial encounter: Secondary | ICD-10-CM | POA: Diagnosis not present

## 2015-04-19 DIAGNOSIS — Y998 Other external cause status: Secondary | ICD-10-CM | POA: Insufficient documentation

## 2015-04-19 DIAGNOSIS — D649 Anemia, unspecified: Secondary | ICD-10-CM | POA: Diagnosis not present

## 2015-04-19 DIAGNOSIS — T148XXA Other injury of unspecified body region, initial encounter: Secondary | ICD-10-CM

## 2015-04-19 DIAGNOSIS — Y92410 Unspecified street and highway as the place of occurrence of the external cause: Secondary | ICD-10-CM | POA: Diagnosis not present

## 2015-04-19 DIAGNOSIS — I10 Essential (primary) hypertension: Secondary | ICD-10-CM | POA: Diagnosis not present

## 2015-04-19 DIAGNOSIS — K219 Gastro-esophageal reflux disease without esophagitis: Secondary | ICD-10-CM | POA: Insufficient documentation

## 2015-04-19 DIAGNOSIS — S233XXA Sprain of ligaments of thoracic spine, initial encounter: Secondary | ICD-10-CM

## 2015-04-19 DIAGNOSIS — Z7984 Long term (current) use of oral hypoglycemic drugs: Secondary | ICD-10-CM | POA: Insufficient documentation

## 2015-04-19 DIAGNOSIS — S239XXA Sprain of unspecified parts of thorax, initial encounter: Secondary | ICD-10-CM | POA: Diagnosis not present

## 2015-04-19 DIAGNOSIS — J45909 Unspecified asthma, uncomplicated: Secondary | ICD-10-CM | POA: Diagnosis not present

## 2015-04-19 DIAGNOSIS — F17200 Nicotine dependence, unspecified, uncomplicated: Secondary | ICD-10-CM | POA: Diagnosis not present

## 2015-04-19 DIAGNOSIS — S29012A Strain of muscle and tendon of back wall of thorax, initial encounter: Secondary | ICD-10-CM | POA: Diagnosis not present

## 2015-04-19 DIAGNOSIS — Y9389 Activity, other specified: Secondary | ICD-10-CM | POA: Insufficient documentation

## 2015-04-19 DIAGNOSIS — S199XXA Unspecified injury of neck, initial encounter: Secondary | ICD-10-CM | POA: Diagnosis present

## 2015-04-19 DIAGNOSIS — Z8739 Personal history of other diseases of the musculoskeletal system and connective tissue: Secondary | ICD-10-CM | POA: Diagnosis not present

## 2015-04-19 MED ORDER — CYCLOBENZAPRINE HCL 10 MG PO TABS
5.0000 mg | ORAL_TABLET | Freq: Once | ORAL | Status: AC
  Administered 2015-04-19: 5 mg via ORAL
  Filled 2015-04-19: qty 1

## 2015-04-19 MED ORDER — IBUPROFEN 200 MG PO TABS
600.0000 mg | ORAL_TABLET | Freq: Once | ORAL | Status: AC
  Administered 2015-04-19: 600 mg via ORAL
  Filled 2015-04-19: qty 3

## 2015-04-19 MED ORDER — IBUPROFEN 600 MG PO TABS: 600.0000 mg | ORAL_TABLET | Freq: Four times a day (QID) | ORAL | Status: DC | PRN

## 2015-04-19 MED ORDER — CYCLOBENZAPRINE HCL 5 MG PO TABS: 5.0000 mg | ORAL_TABLET | Freq: Once | ORAL | Status: DC

## 2015-04-19 NOTE — ED Notes (Signed)
Pt reports understanding of discharge information. No questions at time of discharge 
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
## 2015-04-19 NOTE — Discharge Instructions (Signed)
Cervical Sprain  A cervical sprain is an injury in the neck in which the strong, fibrous tissues (ligaments) that connect your neck bones stretch or tear. Cervical sprains can range from mild to severe. Severe cervical sprains can cause the neck vertebrae to be unstable. This can lead to damage of the spinal cord and can result in serious nervous system problems. The amount of time it takes for a cervical sprain to get better depends on the cause and extent of the injury. Most cervical sprains heal in 1 to 3 weeks.  CAUSES   Severe cervical sprains may be caused by:    Contact sport injuries (such as from football, rugby, wrestling, hockey, auto racing, gymnastics, diving, martial arts, or boxing).    Motor vehicle collisions.    Whiplash injuries. This is an injury from a sudden forward and backward whipping movement of the head and neck.   Falls.   Mild cervical sprains may be caused by:    Being in an awkward position, such as while cradling a telephone between your ear and shoulder.    Sitting in a chair that does not offer proper support.    Working at a poorly designed computer station.    Looking up or down for long periods of time.   SYMPTOMS    Pain, soreness, stiffness, or a burning sensation in the front, back, or sides of the neck. This discomfort may develop immediately after the injury or slowly, 24 hours or more after the injury.    Pain or tenderness directly in the middle of the back of the neck.    Shoulder or upper back pain.    Limited ability to move the neck.    Headache.    Dizziness.    Weakness, numbness, or tingling in the hands or arms.    Muscle spasms.    Difficulty swallowing or chewing.    Tenderness and swelling of the neck.   DIAGNOSIS   Most of the time your health care provider can diagnose a cervical sprain by taking your history and doing a physical exam. Your health care provider will ask about previous neck injuries and any known neck  problems, such as arthritis in the neck. X-rays may be taken to find out if there are any other problems, such as with the bones of the neck. Other tests, such as a CT scan or MRI, may also be needed.   TREATMENT   Treatment depends on the severity of the cervical sprain. Mild sprains can be treated with rest, keeping the neck in place (immobilization), and pain medicines. Severe cervical sprains are immediately immobilized. Further treatment is done to help with pain, muscle spasms, and other symptoms and may include:   Medicines, such as pain relievers, numbing medicines, or muscle relaxants.    Physical therapy. This may involve stretching exercises, strengthening exercises, and posture training. Exercises and improved posture can help stabilize the neck, strengthen muscles, and help stop symptoms from returning.   HOME CARE INSTRUCTIONS    Put ice on the injured area.     Put ice in a plastic bag.     Place a towel between your skin and the bag.     Leave the ice on for 15-20 minutes, 3-4 times a day.    If your injury was severe, you may have been given a cervical collar to wear. A cervical collar is a two-piece collar designed to keep your neck from moving while it heals.      Do not remove the collar unless instructed by your health care provider.    If you have long hair, keep it outside of the collar.    Ask your health care provider before making any adjustments to your collar. Minor adjustments may be required over time to improve comfort and reduce pressure on your chin or on the back of your head.    Ifyou are allowed to remove the collar for cleaning or bathing, follow your health care provider's instructions on how to do so safely.    Keep your collar clean by wiping it with mild soap and water and drying it completely. If the collar you have been given includes removable pads, remove them every 1-2 days and hand wash them with soap and water. Allow them to air dry. They should be completely  dry before you wear them in the collar.    If you are allowed to remove the collar for cleaning and bathing, wash and dry the skin of your neck. Check your skin for irritation or sores. If you see any, tell your health care provider.    Do not drive while wearing the collar.    Only take over-the-counter or prescription medicines for pain, discomfort, or fever as directed by your health care provider.    Keep all follow-up appointments as directed by your health care provider.    Keep all physical therapy appointments as directed by your health care provider.    Make any needed adjustments to your workstation to promote good posture.    Avoid positions and activities that make your symptoms worse.    Warm up and stretch before being active to help prevent problems.   SEEK MEDICAL CARE IF:    Your pain is not controlled with medicine.    You are unable to decrease your pain medicine over time as planned.    Your activity level is not improving as expected.   SEEK IMMEDIATE MEDICAL CARE IF:    You develop any bleeding.   You develop stomach upset.   You have signs of an allergic reaction to your medicine.    Your symptoms get worse.    You develop new, unexplained symptoms.    You have numbness, tingling, weakness, or paralysis in any part of your body.   MAKE SURE YOU:    Understand these instructions.   Will watch your condition.   Will get help right away if you are not doing well or get worse.     This information is not intended to replace advice given to you by your health care provider. Make sure you discuss any questions you have with your health care provider.     Document Released: 11/13/2006 Document Revised: 01/21/2013 Document Reviewed: 07/24/2012  Elsevier Interactive Patient Education 2016 [employer]
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
## 2015-04-25 ENCOUNTER — Emergency Department (HOSPITAL_COMMUNITY)
Admission: EM | Admit: 2015-04-25 | Discharge: 2015-04-26 | Disposition: A | Discharge status: Home / Self Care | Payer: Medicaid Other | Attending: Emergency Medicine | Admitting: Emergency Medicine

## 2015-04-25 DIAGNOSIS — E119 Type 2 diabetes mellitus without complications: Secondary | ICD-10-CM | POA: Diagnosis not present

## 2015-04-25 DIAGNOSIS — K219 Gastro-esophageal reflux disease without esophagitis: Secondary | ICD-10-CM | POA: Insufficient documentation

## 2015-04-25 DIAGNOSIS — M25512 Pain in left shoulder: Secondary | ICD-10-CM | POA: Insufficient documentation

## 2015-04-25 DIAGNOSIS — Z7984 Long term (current) use of oral hypoglycemic drugs: Secondary | ICD-10-CM | POA: Diagnosis not present

## 2015-04-25 DIAGNOSIS — S8012XD Contusion of left lower leg, subsequent encounter: Secondary | ICD-10-CM | POA: Insufficient documentation

## 2015-04-25 DIAGNOSIS — Z862 Personal history of diseases of the blood and blood-forming organs and certain disorders involving the immune mechanism: Secondary | ICD-10-CM | POA: Insufficient documentation

## 2015-04-25 DIAGNOSIS — J45909 Unspecified asthma, uncomplicated: Secondary | ICD-10-CM | POA: Diagnosis not present

## 2015-04-25 DIAGNOSIS — F17200 Nicotine dependence, unspecified, uncomplicated: Secondary | ICD-10-CM | POA: Insufficient documentation

## 2015-04-25 DIAGNOSIS — Z79899 Other long term (current) drug therapy: Secondary | ICD-10-CM | POA: Insufficient documentation

## 2015-04-25 DIAGNOSIS — I10 Essential (primary) hypertension: Secondary | ICD-10-CM | POA: Insufficient documentation

## 2015-04-25 DIAGNOSIS — M79662 Pain in left lower leg: Secondary | ICD-10-CM | POA: Diagnosis present

## 2015-04-26 ENCOUNTER — Encounter (HOSPITAL_COMMUNITY): Payer: Self-pay | Admitting: Emergency Medicine

## 2015-04-26 MED ORDER — HYDROCODONE-ACETAMINOPHEN 5-325 MG PO TABS
1.0000 | ORAL_TABLET | Freq: Once | ORAL | Status: AC
  Administered 2015-04-26: 1 via ORAL
  Filled 2015-04-26: qty 1

## 2015-04-26 MED ORDER — METHOCARBAMOL 500 MG PO TABS: 500.0000 mg | ORAL_TABLET | Freq: Three times a day (TID) | ORAL | Status: DC

## 2015-04-26 MED ORDER — HYDROCODONE-ACETAMINOPHEN 5-325 MG PO TABS: 1.0000 | ORAL_TABLET | ORAL | Status: DC | PRN
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
## 2015-04-26 NOTE — Discharge Instructions (Signed)

## 2015-06-09 ENCOUNTER — Encounter (HOSPITAL_COMMUNITY): Payer: Self-pay | Admitting: Family Medicine

## 2015-06-09 ENCOUNTER — Emergency Department (HOSPITAL_COMMUNITY)
Admission: EM | Admit: 2015-06-09 | Discharge: 2015-06-09 | Disposition: A | Discharge status: Home / Self Care | Payer: Medicaid Other | Attending: Emergency Medicine | Admitting: Emergency Medicine

## 2015-06-09 DIAGNOSIS — Z3202 Encounter for pregnancy test, result negative: Secondary | ICD-10-CM | POA: Insufficient documentation

## 2015-06-09 DIAGNOSIS — K219 Gastro-esophageal reflux disease without esophagitis: Secondary | ICD-10-CM | POA: Insufficient documentation

## 2015-06-09 DIAGNOSIS — D649 Anemia, unspecified: Secondary | ICD-10-CM | POA: Insufficient documentation

## 2015-06-09 DIAGNOSIS — F17200 Nicotine dependence, unspecified, uncomplicated: Secondary | ICD-10-CM | POA: Insufficient documentation

## 2015-06-09 DIAGNOSIS — M25512 Pain in left shoulder: Secondary | ICD-10-CM

## 2015-06-09 DIAGNOSIS — M25562 Pain in left knee: Secondary | ICD-10-CM | POA: Diagnosis not present

## 2015-06-09 DIAGNOSIS — Z7984 Long term (current) use of oral hypoglycemic drugs: Secondary | ICD-10-CM | POA: Diagnosis not present

## 2015-06-09 DIAGNOSIS — J45909 Unspecified asthma, uncomplicated: Secondary | ICD-10-CM | POA: Insufficient documentation

## 2015-06-09 DIAGNOSIS — E119 Type 2 diabetes mellitus without complications: Secondary | ICD-10-CM | POA: Insufficient documentation

## 2015-06-09 DIAGNOSIS — M79605 Pain in left leg: Secondary | ICD-10-CM

## 2015-06-09 DIAGNOSIS — R51 Headache: Secondary | ICD-10-CM | POA: Insufficient documentation

## 2015-06-09 DIAGNOSIS — I10 Essential (primary) hypertension: Secondary | ICD-10-CM | POA: Insufficient documentation

## 2015-06-09 DIAGNOSIS — Z79899 Other long term (current) drug therapy: Secondary | ICD-10-CM | POA: Insufficient documentation

## 2015-06-09 LAB — COMPREHENSIVE METABOLIC PANEL
ALBUMIN: 3.5 g/dL (ref 3.5–5.0)
ALT: 24 U/L (ref 14–54)
AST: 24 U/L (ref 15–41)
Alkaline Phosphatase: 101 U/L (ref 38–126)
Anion gap: 12 (ref 5–15)
BUN: 12 mg/dL (ref 6–20)
CHLORIDE: 104 mmol/L (ref 101–111)
CO2: 22 mmol/L (ref 22–32)
CREATININE: 0.82 mg/dL (ref 0.44–1.00)
Calcium: 9 mg/dL (ref 8.9–10.3)
GFR calc Af Amer: >60 mL/min (ref >60)
GLUCOSE: 250 mg/dL — AB (ref 65–99)
Potassium: 3.5 mmol/L (ref 3.5–5.1)
SODIUM: 138 mmol/L (ref 135–145)
Total Bilirubin: 0.5 mg/dL (ref 0.3–1.2)
Total Protein: 7.5 g/dL (ref 6.5–8.1)

## 2015-06-09 LAB — CBC
HEMATOCRIT: 37.2 % (ref 36.0–46.0)
Hemoglobin: 11.1 g/dL — ABNORMAL LOW (ref 12.0–15.0)
MCH: 24.7 pg — AB (ref 26.0–34.0)
MCHC: 29.8 g/dL — AB (ref 30.0–36.0)
MCV: 82.9 fL (ref 78.0–100.0)
PLATELETS: 394 10*3/uL (ref 150–400)
RBC: 4.49 MIL/uL (ref 3.87–5.11)
RDW: 17.5 % — ABNORMAL HIGH (ref 11.5–15.5)
WBC: 8.7 10*3/uL (ref 4.0–10.5)

## 2015-06-09 LAB — URINE MICROSCOPIC-ADD ON: RBC / HPF: NONE SEEN RBC/hpf (ref 0–5)

## 2015-06-09 LAB — URINALYSIS, ROUTINE W REFLEX MICROSCOPIC
GLUCOSE, UA: 250 mg/dL — AB
HGB URINE DIPSTICK: NEGATIVE
Ketones, ur: 15 mg/dL — AB
LEUKOCYTES UA: NEGATIVE
Nitrite: NEGATIVE
PROTEIN: 30 mg/dL — AB
SPECIFIC GRAVITY, URINE: 1.036 — AB (ref 1.005–1.030)
pH: 5 (ref 5.0–8.0)

## 2015-06-09 LAB — I-STAT BETA HCG BLOOD, ED (MC, WL, AP ONLY)

## 2015-06-09 LAB — D-DIMER, QUANTITATIVE (NOT AT ARMC)

## 2015-06-09 MED ORDER — NAPROXEN 250 MG PO TABS
500.0000 mg | ORAL_TABLET | Freq: Once | ORAL | Status: AC
  Administered 2015-06-09: 500 mg via ORAL
  Filled 2015-06-09: qty 2

## 2015-06-09 MED ORDER — DIAZEPAM 5 MG PO TABS: 5.0000 mg | ORAL_TABLET | Freq: Two times a day (BID) | ORAL | Status: DC

## 2015-06-09 MED ORDER — DIAZEPAM 5 MG PO TABS
5.0000 mg | ORAL_TABLET | Freq: Once | ORAL | Status: AC
  Administered 2015-06-09: 5 mg via ORAL
  Filled 2015-06-09: qty 1

## 2015-06-09 MED ORDER — NAPROXEN 500 MG PO TABS: 500.0000 mg | ORAL_TABLET | Freq: Two times a day (BID) | ORAL | Status: DC

## 2015-06-09 NOTE — ED Provider Notes (Signed)
CSN: [id number]     Arrival date & time 06/09/15  1313 History   First MD Initiated Contact with Patient 06/09/15 1736     Chief Complaint  Patient presents with  . Shoulder Pain  . Leg Pain  . Headache   HPI Crystal Brewer is a 40 y.o. female PMH significant for HTN, DM, GERD presenting with a few month history of left shoulder and left knee pain. She describes her pain as 10 out of 10 pain scale, chronic, achy, nonradiating, worse with palpation. She states that the pain feels like it is "coming from the inside." She denies fevers, chills, chest pain, shortness of breath, abdominal pain, nausea, vomiting, changes in bowel or bladder habits, recent surgeries, leg swelling or discoloration, history of blood clot or cancer history.  Past Medical History  Diagnosis Date  . Thyroid disease   . Hypertension   . Anemia   . Asthma   . GERD (gastroesophageal reflux disease)   . Diabetes mellitus without complication ([place])   . Headache(784.0)   . Gallstones   . Bleeding disorder ([place])     "UNKNOWN PROBLEM" has not seen "blood doctor "yet  . Hx of degenerative disc disease    Past Surgical History  Procedure Laterality Date  . Cholecystectomy N/A 09/26/2013    Procedure: LAPAROSCOPIC CHOLECYSTECTOMY WITH INTRAOPERATIVE CHOLANGIOGRAM;  Surgeon: Earnstine Regal, MD;  Location: [facility];  Service: General;  Laterality: N/A;   Family History  Problem Relation Age of Onset  . Cancer Mother     Pancreatic Cancer   Social History  Substance Use Topics  . Smoking status: Current Every Day Smoker  . Smokeless tobacco: None  . Alcohol Use: No     Comment: occ   OB History    No data available     Review of Systems  Ten systems are reviewed and are negative for acute change except as noted in the HPI  Allergies  Review of patient's allergies indicates no known allergies.  Home Medications   Prior to Admission medications   Medication Sig Start Date End Date Taking? Authorizing Provider   albuterol (PROVENTIL HFA;VENTOLIN HFA) 108 (90 BASE) MCG/ACT inhaler Inhale 2 puffs into the lungs every 4 (four) hours as needed for wheezing or shortness of breath.   Yes Historical Provider, MD  canagliflozin (INVOKANA) 100 MG TABS tablet Take 100 mg by mouth daily.   Yes Historical Provider, MD  cetirizine (ZYRTEC ALLERGY) 10 MG tablet Take 1 tablet (10 mg total) by mouth daily. 08/18/14  Yes [name], PA-C  cyclobenzaprine (FLEXERIL) 5 MG tablet Take 1 tablet (5 mg total) by mouth once. Patient taking differently: Take 5 mg by mouth every evening.  04/19/15  Yes Junius Creamer, NP  Fe Fum-Fe Poly-Vit C-Lactobac (FUSION) 65-65-25-30 MG CAPS Take 1 tablet by mouth daily.   Yes Historical Provider, MD  gabapentin (NEURONTIN) 300 MG capsule Take 300 mg by mouth 2 (two) times daily as needed (nerve pain).    Yes Historical Provider, MD  HYDROcodone-acetaminophen (NORCO/VICODIN) 5-325 MG tablet Take 1 tablet by mouth every 4 (four) hours as needed. 04/26/15  Yes Tiffany Carlota Raspberry, PA-C  ibuprofen (ADVIL,MOTRIN) 600 MG tablet Take 1 tablet (600 mg total) by mouth every 6 (six) hours as needed. 04/19/15  Yes Junius Creamer, NP  lisinopril-hydrochlorothiazide (PRINZIDE,ZESTORETIC) 20-12.5 MG per tablet Take 1 tablet by mouth every morning.    Yes Historical Provider, MD  metFORMIN (GLUCOPHAGE) 500 MG tablet Take 500 mg by mouth  2 (two) times daily with a meal.   Yes Historical Provider, MD  pantoprazole (PROTONIX) 40 MG tablet Take 40 mg by mouth daily.   Yes Historical Provider, MD  diazepam (VALIUM) 5 MG tablet Take 1 tablet (5 mg total) by mouth 2 (two) times daily. 06/09/15   [place] Lions, PA-C  HYDROcodone-acetaminophen (NORCO/VICODIN) 5-325 MG per tablet Take 1-2 tablets by mouth every 4 (four) hours as needed for moderate pain. Patient not taking: Reported on 08/29/2014 09/26/13   Armandina Gemma, MD  ketorolac (ACULAR) 0.5 % ophthalmic solution Place 1 drop into the left eye 4 (four) times  daily. Patient not taking: Reported on 01/15/2015 08/18/14   [name], PA-C  meloxicam (MOBIC) 7.5 MG tablet Take 1 tablet (7.5 mg total) by mouth 2 (two) times daily. Patient not taking: Reported on 08/29/2014 04/28/14   Carman Ching, PA-C  methocarbamol (ROBAXIN) 500 MG tablet Take 1 tablet (500 mg total) by mouth 3 (three) times daily. 04/26/15   Tiffany Carlota Raspberry, PA-C  naproxen (NAPROSYN) 500 MG tablet Take 1 tablet (500 mg total) by mouth 2 (two) times daily. 06/09/15   [place] Lions, PA-C  promethazine (PHENERGAN) 25 MG tablet Take 1 tablet (25 mg total) by mouth every 6 (six) hours as needed for nausea or vomiting. Patient not taking: Reported on 08/29/2014 09/26/13   Armandina Gemma, MD  traMADol (ULTRAM) 50 MG tablet Take 1 tablet (50 mg total) by mouth every 6 (six) hours as needed. Patient not taking: Reported on 01/15/2015 04/28/14   Hessie Diener Hess, PA-C   BP 122/74 mmHg  Pulse 71  Temp(Src) 98.7 F (37.1 C) (Oral)  Resp 16  SpO2 96%  LMP 06/02/2015 Physical Exam  Constitutional: She appears well-developed and well-nourished. No distress.  HENT:  Head: Normocephalic and atraumatic.  Mouth/Throat: Oropharynx is clear and moist. No oropharyngeal exudate.  Eyes: Conjunctivae are normal. Pupils are equal, round, and reactive to light. Right eye exhibits no discharge. Left eye exhibits no discharge. No scleral icterus.  Neck: No tracheal deviation present.  Cardiovascular: Normal rate, regular rhythm, normal heart sounds and intact distal pulses.  Exam reveals no gallop and no friction rub.   No murmur heard. Pulmonary/Chest: Effort normal and breath sounds normal. No respiratory distress. She has no wheezes. She has no rales. She exhibits no tenderness.  Abdominal: Soft. Bowel sounds are normal. She exhibits no distension and no mass. There is no tenderness. There is no rebound and no guarding.  Musculoskeletal: Normal range of motion. She exhibits tenderness. She exhibits no  edema.  Diffuse left knee tenderness and diffuse left shoulder tenderness. No swelling or discoloration. NVI BL.   Lymphadenopathy:    She has no cervical adenopathy.  Neurological: She is alert. Coordination normal.  Skin: Skin is warm and dry. No rash noted. She is not diaphoretic. No erythema.  Psychiatric: She has a normal mood and affect. Her behavior is normal.  Nursing note and vitals reviewed.   ED Course  Procedures  Labs Review Labs Reviewed  COMPREHENSIVE METABOLIC PANEL - Abnormal; Notable for the following:    Glucose, Bld 250 (*)    All other components within normal limits  CBC - Abnormal; Notable for the following:    Hemoglobin 11.1 (*)    MCH 24.7 (*)    MCHC 29.8 (*)    RDW 17.5 (*)    All other components within normal limits  URINALYSIS, ROUTINE W REFLEX MICROSCOPIC (NOT AT [facility]) - Abnormal; Notable  for the following:    Specific Gravity, Urine 1.036 (*)    Glucose, UA 250 (*)    Bilirubin Urine SMALL (*)    Ketones, ur 15 (*)    Protein, ur 30 (*)    All other components within normal limits  URINE MICROSCOPIC-ADD ON - Abnormal; Notable for the following:    Squamous Epithelial / LPF 0-5 (*)    Bacteria, UA FEW (*)    All other components within normal limits  D-DIMER, QUANTITATIVE (NOT AT [facility])  I-STAT BETA HCG BLOOD, ED ([facility], [facility], [facility] ONLY)   MDM   Final diagnoses:  Left shoulder pain  Left leg pain   Patient nontoxic-appearing, vital signs stable. Her left shoulder pain and left knee pain appear to be chronic and MSK in nature. D-dimer ordered to evaluate for DVT. This was negative. Doubt infectious etiologies as there is no erythema, drainage, warmth at either of these joints. CBC, CMP, hCG, UA unremarkable for acute change. Patient received Valium and naproxen for her pain. Upon reevaluation, patient was asleep. After reviewing the narcotic database, it appears that patient gets a monthly prescription of Vicodin. No narcotics will be given at  discharge today. Patient will be sent home with naproxen and Valium for her pain. Patient may be safely discharged home. Discussed reasons for return. Patient to follow-up with primary care provider within one week. Patient in understanding and agreement with the plan.   Waian[place] Lions, PA-C 06/09/15 2239  Merrily Pew, MD 06/09/15 [phone]

## 2015-06-09 NOTE — ED Notes (Signed)
Pt here for pain in her entire left side. sts that is has been going on for months and is intermittent. sts from head to toe. sts the pain will last for a few days and then resolve. sts nothing makes it better or worse. Sts her sister had the same symptoms and was diagnosed with Lupus. Sts she has been seen for this multiple times ans no dx,

## 2015-06-09 NOTE — ED Notes (Signed)
Pt stable, ambulatory, states understanding of discharge instructions 

## 2015-06-09 NOTE — Discharge Instructions (Signed)
Ms. Crystal Brewer,  Nice meeting you! Please follow-up with your primary care provider. Return to the emergency department if you develop increased pain, shortness of breath, abdominal pain, nausea/vomiting. Feel better soon!  S. Wendie Simmer, PA-C Joint Pain Joint pain, which is also called arthralgia, can be caused by many things. Joint pain often goes away when you follow your health care provider's instructions for relieving pain at home. However, joint pain can also be caused by conditions that require further treatment. Common causes of joint pain include:  Bruising in the area of the joint.  Overuse of the joint.  Wear and tear on the joints that occur with aging (osteoarthritis).  Various other forms of arthritis.  A buildup of a crystal form of uric acid in the joint (gout).  Infections of the joint (septic arthritis) or of the bone (osteomyelitis). Your health care provider may recommend medicine to help with the pain. If your joint pain continues, additional tests may be needed to diagnose your condition. HOME CARE INSTRUCTIONS Watch your condition for any changes. Follow these instructions as directed to lessen the pain that you are feeling.  Take medicines only as directed by your health care provider.  Rest the affected area for as long as your health care provider says that you should. If directed to do so, raise the painful joint above the level of your heart while you are sitting or lying down.  Do not do things that cause or worsen pain.  If directed, apply ice to the painful area:  Put ice in a plastic bag.  Place a towel between your skin and the bag.  Leave the ice on for 20 minutes, 2-3 times per day.  Wear an elastic bandage, splint, or sling as directed by your health care provider. Loosen the elastic bandage or splint if your fingers or toes become numb and tingle, or if they turn cold and blue.  Begin exercising or stretching the affected area as  directed by your health care provider. Ask your health care provider what types of exercise are safe for you.  Keep all follow-up visits as directed by your health care provider. This is important. SEEK MEDICAL CARE IF:  Your pain increases, and medicine does not help.  Your joint pain does not improve within 3 days.  You have increased bruising or swelling.  You have a fever.  You lose 10 lb (4.5 kg) or more without trying. SEEK IMMEDIATE MEDICAL CARE IF:  You are not able to move the joint.  Your fingers or toes become numb or they turn cold and blue.   This information is not intended to replace advice given to you by your health care provider. Make sure you discuss any questions you have with your health care provider.   Document Released: 01/16/2005 Document Revised: 02/06/2014 Document Reviewed: 10/28/2013 Elsevier Interactive Patient Education [employer].

## 2015-07-19 ENCOUNTER — Encounter (HOSPITAL_COMMUNITY): Payer: Self-pay | Admitting: Emergency Medicine

## 2015-07-19 ENCOUNTER — Emergency Department (HOSPITAL_COMMUNITY)
Admission: EM | Admit: 2015-07-19 | Discharge: 2015-07-19 | Disposition: A | Discharge status: Home / Self Care | Payer: Medicaid Other | Attending: Emergency Medicine | Admitting: Emergency Medicine

## 2015-07-19 DIAGNOSIS — I10 Essential (primary) hypertension: Secondary | ICD-10-CM | POA: Insufficient documentation

## 2015-07-19 DIAGNOSIS — J45909 Unspecified asthma, uncomplicated: Secondary | ICD-10-CM | POA: Diagnosis not present

## 2015-07-19 DIAGNOSIS — M7989 Other specified soft tissue disorders: Secondary | ICD-10-CM

## 2015-07-19 DIAGNOSIS — E119 Type 2 diabetes mellitus without complications: Secondary | ICD-10-CM | POA: Diagnosis not present

## 2015-07-19 DIAGNOSIS — Y929 Unspecified place or not applicable: Secondary | ICD-10-CM | POA: Insufficient documentation

## 2015-07-19 DIAGNOSIS — S63602A Unspecified sprain of left thumb, initial encounter: Secondary | ICD-10-CM

## 2015-07-19 DIAGNOSIS — F17200 Nicotine dependence, unspecified, uncomplicated: Secondary | ICD-10-CM | POA: Diagnosis not present

## 2015-07-19 DIAGNOSIS — Y939 Activity, unspecified: Secondary | ICD-10-CM | POA: Insufficient documentation

## 2015-07-19 DIAGNOSIS — Y999 Unspecified external cause status: Secondary | ICD-10-CM | POA: Insufficient documentation

## 2015-07-19 DIAGNOSIS — S6992XA Unspecified injury of left wrist, hand and finger(s), initial encounter: Secondary | ICD-10-CM | POA: Diagnosis present

## 2015-07-19 DIAGNOSIS — W228XXA Striking against or struck by other objects, initial encounter: Secondary | ICD-10-CM | POA: Insufficient documentation

## 2015-07-19 MED ORDER — DOXYCYCLINE HYCLATE 100 MG PO CAPS: 100.0000 mg | ORAL_CAPSULE | Freq: Two times a day (BID) | ORAL | Status: DC

## 2015-07-19 MED ORDER — DOXYCYCLINE HYCLATE 100 MG PO TABS
100.0000 mg | ORAL_TABLET | Freq: Once | ORAL | Status: AC
  Administered 2015-07-19: 100 mg via ORAL
  Filled 2015-07-19: qty 1

## 2015-07-19 MED ORDER — NAPROXEN 500 MG PO TABS
500.0000 mg | ORAL_TABLET | Freq: Two times a day (BID) | ORAL | Status: DC
Start: 2015-07-19 — End: 2015-10-02

## 2015-07-19 NOTE — Discharge Instructions (Signed)
Take doxycycline as prescribed until finished. We recommend that you apply warm compresses to your chin 3-4 times per day for 15-20 minutes each time. Wear a wrist and thumb brace for comfort. You may take naproxen for pain control as prescribed. Follow-up with your primary care doctor for a recheck of symptoms. Return to the ED, as needed, for worsening symptoms.  Acne Acne is a skin problem that causes pimples. Acne occurs when the pores in the skin get blocked. The pores may become infected with bacteria, or they may become red, sore, and swollen. Acne is a common skin problem, especially for teenagers. Acne usually goes away over time. CAUSES Each pore contains an oil gland. Oil glands make an oily substance that is called sebum. Acne happens when these glands get plugged with sebum, dead skin cells, and dirt. Then, the bacteria that are normally found in the oil glands multiply and cause inflammation. Acne is commonly triggered by changes in your hormones. These hormonal changes can cause the oil glands to get bigger and to make more sebum. Factors that can make acne worse include:  Hormone changes during:  Adolescence.  Women's menstrual cycles.  Pregnancy.  Oil-based cosmetics and hair products.  Harshly scrubbing the skin.  Strong soaps.  Stress.  Hormone problems that are due to certain diseases.  Long or oily hair rubbing against the skin.  Certain medicines.  Pressure from headbands, backpacks, or shoulder pads.  Exposure to certain oils and chemicals. RISK FACTORS This condition is more likely to develop in:  Teenagers.  People who have a family history of acne. SYMPTOMS Acne often occurs on the face, neck, chest, and upper back. Symptoms include:  Small, red bumps (pimples or papules).  Whiteheads.  Blackheads.  Small, pus-filled pimples (pustules).  Big, red pimples or pustules that feel tender. More severe acne can cause:  An infected area that  contains a collection of pus (abscess).  Hard, painful, fluid-filled sacs (cysts).  Scars. DIAGNOSIS This condition is diagnosed with a medical history and physical exam. Blood tests may also be done. TREATMENT Treatment for this condition can vary depending on the severity of your acne. Treatment may include:  Creams and lotions that prevent oil glands from clogging.  Creams and lotions that treat or prevent infections and inflammation.  Antibiotic medicines that are applied to the skin or taken as a pill.  Pills that decrease sebum production.  Birth control pills.  Light or laser treatments.  Surgery.  Injections of medicine into the affected areas.  Chemicals that cause peeling of the skin. Your health care provider will also recommend the best way to take care of your skin. Good skin care is the most important part of treatment. HOME CARE INSTRUCTIONS Skin Care Take care of your skin as told by your health care provider. You may be told to do these things:  Wash your skin gently at least two times each day, as well as:  After you exercise.  Before you go to bed.  Use mild soap.  Apply a water-based skin moisturizer after you wash your skin.  Use a sunscreen or sunblock with SPF 30 or greater. This is especially important if you are using acne medicines.  Choose cosmetics that will not plug your oil glands (are noncomedogenic). Medicines  Take over-the-counter and prescription medicines only as told by your health care provider.  If you were prescribed an antibiotic medicine, apply or take it as told by your health care provider. Do  not stop taking the antibiotic even if your condition improves. General Instructions  Keep your hair clean and off of your face. If you have oily hair, shampoo your hair regularly or daily.  Avoid leaning your chin or forehead against your hands.  Avoid wearing tight headbands or hats.  Avoid picking or squeezing your  pimples. That can make your acne worse and cause scarring.  Keep all follow-up visits as told by your health care provider. This is important.  Shave gently and only when necessary.  Keep a food journal to figure out if any foods are linked with your acne. SEEK MEDICAL CARE IF:  Your acne is not better after eight weeks.  Your acne gets worse.  You have a large area of skin that is red or tender.  You think that you are having side effects from any acne medicine.   This information is not intended to replace advice given to you by your health care provider. Make sure you discuss any questions you have with your health care provider.   Document Released: 01/14/2000 Document Revised: 10/07/2014 Document Reviewed: 03/25/2014 Elsevier Interactive Patient Education 2016 Else[employer]  Thumb Sprain A thumb sprain is an injury to one of the strong bands of tissue (ligaments) that connect the bones in your thumb. The ligament can be stretched too much or it can tear. A tear can be either partial or complete. The severity of the sprain depends on how much of the ligament was damaged or torn. CAUSES A thumb sprain is often caused by a fall or an accident. If you extend your hands to catch an object or to protect yourself, the force of the impact can cause your ligament to stretch too much. This excess tension can also cause your ligament to tear. RISK FACTORS This injury is more likely to occur in people who play:  Sports that involve a greater risk of falling, such as skiing.  Sports that involve catching an object, such as basketball. SYMPTOMS Symptoms of this condition include:  Loss of motion in your thumb.  Bruising.  Tenderness.  Swelling. DIAGNOSIS This condition is diagnosed with a medical history and physical exam. You may also have an X-ray of your thumb. TREATMENT Treatment varies depending on the severity of your sprain. If your ligament is overstretched or partially  torn, treatment usually involves keeping your thumb in a fixed position (immobilization) for a period of time. To help you do this, your health care provider will apply a bandage, cast, or splint to keep your thumb from moving until it heals. If your ligament is fully torn, you may need surgery to reconnect the ligament to the bone. After surgery, a cast or splint will be applied and will need to stay on your thumb while it heals. Your health care provider may also suggest exercises or physical therapy to strengthen your thumb. HOME CARE INSTRUCTIONS If You Have a Cast:  Do not stick anything inside the cast to scratch your skin. Doing that increases your risk of infection.  Check the skin around the cast every day. Report any concerns to your health care provider. You may put lotion on dry skin around the edges of the cast. Do not apply lotion to the skin underneath the cast.  Keep the cast clean and dry. If You Have a Splint:  Wear it as directed by your health care provider. Remove it only as directed by your health care provider.  Loosen the splint if your fingers  become numb and tingle, or if they turn cold and blue.  Keep the splint clean and dry. Bathing  Cover the bandage, cast, or splint with a watertight plastic bag to protect it from water while you take a bath or a shower. Do not let the bandage, cast, or splint get wet. Managing Pain, Stiffness, and Swelling   If directed, apply ice to the injured area (unless you have a cast):  Put ice in a plastic bag.  Place a towel between your skin and the bag.  Leave the ice on for 20 minutes, 2-3 times per day.  Move your fingers often to avoid stiffness and to lessen swelling.  Raise (elevate) the injured area above the level of your heart while you are sitting or lying down. Driving  Do not drive or operate heavy machinery while taking pain medicine.  Do not drive while wearing a cast or splint on a hand that you use for  driving. General Instructions  Do not put pressure on any part of your cast or splint until it is fully hardened. This may take several hours.  Take medicines only as directed by your health care provider. These include over-the-counter medicines and prescription medicines.  Keep all follow-up visits as directed by your health care provider. This is important.  Do any exercise or physical therapy as directed by your health care provider.  Do not wear rings on your injured thumb. SEEK MEDICAL CARE IF:  Your pain is not controlled with medicine.  Your bruising or swelling gets worse.  Your cast or splint is damaged. SEEK IMMEDIATE MEDICAL CARE IF:  Your thumb is numb or blue.  Your thumb feels colder than normal.   This information is not intended to replace advice given to you by your health care provider. Make sure you discuss any questions you have with your health care provider.   Document Released: 02/24/2004 Document Revised: 06/02/2014 Document Reviewed: 10/28/2013 Elsevier Interactive Patient Education [employer].

## 2015-07-19 NOTE — ED Notes (Signed)
Patient with c/o right jaw/chin pain, and left palm/thumb pain with onset on June 1st.  CMS intact to left hand

## 2015-07-19 NOTE — ED Provider Notes (Signed)
CSN: [id number]     Arrival date & time 07/19/15  0315 History   First MD Initiated Contact with Patient 07/19/15 0408     Chief Complaint  Patient presents with  . Facial Pain     (Consider location/radiation/quality/duration/timing/severity/associated sxs/prior Treatment) HPI Comments: 40 year old female with a history of thyroid disease, hypertension, esophageal reflux, diabetes mellitus, and asthma presents to the emergency department for 2 complaints. Patient with primary complaint of pain to the right aspect of her chin. She states that she was stepping out of a truck when she lost her footing and fell forward causing her chin to strike the side of a car beside her. She denies any loss of consciousness and has noted some aching pain to the area which is worse with palpation. She denies taking any medications for her symptoms. She has had no difficulty opening her jaw, eating, dental pain, or fever.  Patient was secondary complaint of pain to the palmar aspect of her left thumb. She cannot indicate how long this has been persisting, but believes it began one month ago. She has had no numbness or weakness in her hand. She cannot recall a specific trauma or injury. She is right-hand dominant. No medications taken prior to arrival for pain.  The history is provided by the patient. No language interpreter was used.    Past Medical History  Diagnosis Date  . Thyroid disease   . Hypertension   . Anemia   . Asthma   . GERD (gastroesophageal reflux disease)   . Diabetes mellitus without complication ([place])   . Headache(784.0)   . Gallstones   . Bleeding disorder ([place])     "UNKNOWN PROBLEM" has not seen "blood doctor "yet  . Hx of degenerative disc disease    Past Surgical History  Procedure Laterality Date  . Cholecystectomy N/A 09/26/2013    Procedure: LAPAROSCOPIC CHOLECYSTECTOMY WITH INTRAOPERATIVE CHOLANGIOGRAM;  Surgeon: Earnstine Regal, MD;  Location: [facility];  Service: General;   Laterality: N/A;   Family History  Problem Relation Age of Onset  . Cancer Mother     Pancreatic Cancer   Social History  Substance Use Topics  . Smoking status: Current Every Day Smoker  . Smokeless tobacco: None  . Alcohol Use: No     Comment: occ   OB History    No data available      Review of Systems  Constitutional: Negative for fever.  Musculoskeletal: Positive for myalgias.  Skin: Positive for wound.  Neurological: Negative for weakness and numbness.  All other systems reviewed and are negative.   Allergies  Review of patient's allergies indicates no known allergies.  Home Medications   Prior to Admission medications   Medication Sig Start Date End Date Taking? Authorizing Provider  albuterol (PROVENTIL HFA;VENTOLIN HFA) 108 (90 BASE) MCG/ACT inhaler Inhale 2 puffs into the lungs every 4 (four) hours as needed for wheezing or shortness of breath.    Historical Provider, MD  canagliflozin (INVOKANA) 100 MG TABS tablet Take 100 mg by mouth daily.    Historical Provider, MD  cetirizine (ZYRTEC ALLERGY) 10 MG tablet Take 1 tablet (10 mg total) by mouth daily. 08/18/14   [name], PA-C  cyclobenzaprine (FLEXERIL) 5 MG tablet Take 1 tablet (5 mg total) by mouth once. Patient taking differently: Take 5 mg by mouth every evening.  04/19/15   Junius Creamer, NP  diazepam (VALIUM) 5 MG tablet Take 1 tablet (5 mg total) by mouth 2 (two) times daily.  06/09/15   Runnells Lions, PA-C  doxycycline (VIBRAMYCIN) 100 MG capsule Take 1 capsule (100 mg total) by mouth 2 (two) times daily. 07/19/15   Antonietta Breach, PA-C  Fe Fum-Fe Poly-Vit C-Lactobac (FUSION) 65-65-25-30 MG CAPS Take 1 tablet by mouth daily.    Historical Provider, MD  gabapentin (NEURONTIN) 300 MG capsule Take 300 mg by mouth 2 (two) times daily as needed (nerve pain).     Historical Provider, MD  HYDROcodone-acetaminophen (NORCO/VICODIN) 5-325 MG per tablet Take 1-2 tablets by mouth every 4 (four) hours as needed  for moderate pain. Patient not taking: Reported on 08/29/2014 09/26/13   Armandina Gemma, MD  HYDROcodone-acetaminophen (NORCO/VICODIN) 5-325 MG tablet Take 1 tablet by mouth every 4 (four) hours as needed. 04/26/15   Tiffany Carlota Raspberry, PA-C  ibuprofen (ADVIL,MOTRIN) 600 MG tablet Take 1 tablet (600 mg total) by mouth every 6 (six) hours as needed. 04/19/15   Junius Creamer, NP  ketorolac (ACULAR) 0.5 % ophthalmic solution Place 1 drop into the left eye 4 (four) times daily. Patient not taking: Reported on 01/15/2015 08/18/14   [name], PA-C  lisinopril-hydrochlorothiazide (PRINZIDE,ZESTORETIC) 20-12.5 MG per tablet Take 1 tablet by mouth every morning.     Historical Provider, MD  meloxicam (MOBIC) 7.5 MG tablet Take 1 tablet (7.5 mg total) by mouth 2 (two) times daily. Patient not taking: Reported on 08/29/2014 04/28/14   Carman Ching, PA-C  metFORMIN (GLUCOPHAGE) 500 MG tablet Take 500 mg by mouth 2 (two) times daily with a meal.    Historical Provider, MD  methocarbamol (ROBAXIN) 500 MG tablet Take 1 tablet (500 mg total) by mouth 3 (three) times daily. 04/26/15   Tiffany Carlota Raspberry, PA-C  naproxen (NAPROSYN) 500 MG tablet Take 1 tablet (500 mg total) by mouth 2 (two) times daily. 07/19/15   Antonietta Breach, PA-C  pantoprazole (PROTONIX) 40 MG tablet Take 40 mg by mouth daily.    Historical Provider, MD  promethazine (PHENERGAN) 25 MG tablet Take 1 tablet (25 mg total) by mouth every 6 (six) hours as needed for nausea or vomiting. Patient not taking: Reported on 08/29/2014 09/26/13   Armandina Gemma, MD  traMADol (ULTRAM) 50 MG tablet Take 1 tablet (50 mg total) by mouth every 6 (six) hours as needed. Patient not taking: Reported on 01/15/2015 04/28/14   Hessie Diener Hess, PA-C   BP 143/88 mmHg  Pulse 95  Temp(Src) 98.6 F (37 C) (Oral)  Resp 18  Wt 115.2 kg  SpO2 99%  LMP 06/28/2015 (Exact Date)   Physical Exam  Constitutional: She is oriented to person, place, and time. She appears well-developed and  well-nourished. No distress.  Nontoxic appearing and in no distress.  HENT:  Head: Normocephalic and atraumatic.    Mouth/Throat: Uvula is midline, oropharynx is clear and moist and mucous membranes are normal. No trismus in the jaw.  Normal jaw movement. No dental tenderness, though dentition is poor. No gingival swelling or fluctuance. Oral floor is soft. No trismus.  Eyes: Conjunctivae and EOM are normal. No scleral icterus.  Neck: Normal range of motion.  Cardiovascular: Normal rate, regular rhythm and intact distal pulses.   Distal radial pulse 2+ in the LUE  Pulmonary/Chest: Effort normal. No respiratory distress.  Respirations even and unlabored  Musculoskeletal: Normal range of motion.  Tenderness to palpation to the left thenar eminence without bony deformity or crepitus. Negative Finkelstein's test. Normal range of motion of the left hand and all digits.  Neurological: She is alert and oriented  to person, place, and time.  Skin: Skin is warm and dry. No rash noted. She is not diaphoretic. No erythema. No pallor.  Psychiatric: She has a normal mood and affect. Her behavior is normal.  Nursing note and vitals reviewed.   ED Course  Procedures (including critical care time) Labs Review Labs Reviewed - No data to display  Imaging Review No results found. I have personally reviewed and evaluated these images and lab results as part of my medical decision-making.   EKG Interpretation None      MDM   Final diagnoses:  Nodule of soft tissue  Thumb sprain, left, initial encounter    41 year old female presents to the emergency department for complaints of chin pain as well as pain to her left thumb. Thumb pain consistent with a sprain. Patient is neurovascularly intact. She has normal range of motion of her right hand and digits. No erythema or induration to suggest infectious etiology. Patient given thumb spica brace for comfort and stability. Have discussed continued use  of ice and NSAIDs.  Patient also complaining of pain to the right lower part of her chin. This area is slightly indurated and more nodular on palpation. There is mild overlying erythema without fluctuance. No red linear streaking. Symptoms have been persistent over the past month. Given chronicity of symptoms, low suspicion for spreading infection. In light of diabetic history, however, will cover with antibiotics; patient started on doxycycline. I recommended the use of warm compresses as well as outpatient primary care follow-up. Patient agreeable to plan with no unaddressed concerns; discharged in satisfactory condition.  Antonietta Breach, PA-C 07/19/15 0543  Varney Biles, MD 07/19/15 [phone]

## 2015-10-01 ENCOUNTER — Encounter (HOSPITAL_COMMUNITY): Payer: Self-pay | Admitting: Emergency Medicine

## 2015-10-01 ENCOUNTER — Emergency Department (HOSPITAL_COMMUNITY): Payer: Medicaid Other

## 2015-10-01 DIAGNOSIS — I10 Essential (primary) hypertension: Secondary | ICD-10-CM | POA: Diagnosis not present

## 2015-10-01 DIAGNOSIS — Z7984 Long term (current) use of oral hypoglycemic drugs: Secondary | ICD-10-CM | POA: Insufficient documentation

## 2015-10-01 DIAGNOSIS — F17200 Nicotine dependence, unspecified, uncomplicated: Secondary | ICD-10-CM | POA: Insufficient documentation

## 2015-10-01 DIAGNOSIS — R062 Wheezing: Secondary | ICD-10-CM | POA: Insufficient documentation

## 2015-10-01 DIAGNOSIS — E119 Type 2 diabetes mellitus without complications: Secondary | ICD-10-CM | POA: Diagnosis not present

## 2015-10-01 NOTE — ED Triage Notes (Signed)
Pt. reports productive cough with chest congestion and wheezing onset this evening after inhaling cleaning solution at bathroom at home .

## 2015-10-02 ENCOUNTER — Emergency Department (HOSPITAL_COMMUNITY)
Admission: EM | Admit: 2015-10-02 | Discharge: 2015-10-02 | Disposition: A | Discharge status: Home / Self Care | Payer: Medicaid Other | Attending: Emergency Medicine | Admitting: Emergency Medicine

## 2015-10-02 DIAGNOSIS — R062 Wheezing: Secondary | ICD-10-CM

## 2015-10-02 HISTORY — DX: Obesity, unspecified: E66.9

## 2015-10-02 MED ORDER — ALBUTEROL SULFATE (2.5 MG/3ML) 0.083% IN NEBU
5.0000 mg | INHALATION_SOLUTION | Freq: Once | RESPIRATORY_TRACT | Status: AC
  Administered 2015-10-02: 5 mg via RESPIRATORY_TRACT
  Filled 2015-10-02: qty 6

## 2015-10-02 MED ORDER — ALBUTEROL SULFATE HFA 108 (90 BASE) MCG/ACT IN AERS
2.0000 | INHALATION_SPRAY | RESPIRATORY_TRACT | Status: DC | PRN
  Administered 2015-10-02: 2 via RESPIRATORY_TRACT
  Filled 2015-10-02: qty 6.7

## 2015-10-02 MED ORDER — IPRATROPIUM BROMIDE 0.02 % IN SOLN
0.5000 mg | Freq: Once | RESPIRATORY_TRACT | Status: AC
  Administered 2015-10-02: 0.5 mg via RESPIRATORY_TRACT
  Filled 2015-10-02: qty 2.5

## 2015-10-02 MED ORDER — PREDNISONE 20 MG PO TABS
60.0000 mg | ORAL_TABLET | Freq: Once | ORAL | Status: AC
  Administered 2015-10-02: 60 mg via ORAL
  Filled 2015-10-02: qty 3

## 2015-10-02 NOTE — ED Notes (Signed)
Pt stable, ambulatory, states understanding of discharge instructions 

## 2015-10-02 NOTE — ED Provider Notes (Signed)
[facility] DEPT Provider Note   CSN: [id number] Arrival date & time: 10/01/15  2312     History   Chief Complaint Chief Complaint  Patient presents with  . Cough    HPI Crystal Brewer is a 40 y.o. female.  HPI   Patient possess the emergency department for evaluation after being exposed to diluted bleach. She states she was cleaning with bleach product and developed some wheezing afterwards. She has a history of chronic bronchitis. She denies having any respiratory distress, coughing, throat closing, loss of consciousness, nausea, vomiting, diarrhea, headache. She also states that she is out of her albuterol inhaler will need a refill.  Past Medical History:  Diagnosis Date  . Anemia   . Asthma   . Bleeding disorder ([place])    "UNKNOWN PROBLEM" has not seen "blood doctor "yet  . Diabetes mellitus without complication ([place])   . Gallstones   . GERD (gastroesophageal reflux disease)   . Headache(784.0)   . Hx of degenerative disc disease   . Hypertension   . Obesity   . Thyroid disease     Patient Active Problem List   Diagnosis Date Noted  . Cholecystitis with cholelithiasis 09/26/2013  . Cholelithiasis with cholecystitis 09/02/2013    Past Surgical History:  Procedure Laterality Date  . CHOLECYSTECTOMY N/A 09/26/2013   Procedure: LAPAROSCOPIC CHOLECYSTECTOMY WITH INTRAOPERATIVE CHOLANGIOGRAM;  Surgeon: Earnstine Regal, MD;  Location: [facility];  Service: General;  Laterality: N/A;    OB History    No data available       Home Medications    Prior to Admission medications   Medication Sig Start Date End Date Taking? Authorizing Provider  albuterol (PROVENTIL HFA;VENTOLIN HFA) 108 (90 BASE) MCG/ACT inhaler Inhale 2 puffs into the lungs every 4 (four) hours as needed for wheezing or shortness of breath.   Yes Historical Provider, MD  canagliflozin (INVOKANA) 100 MG TABS tablet Take 100 mg by mouth daily.   Yes Historical Provider, MD  cetirizine (ZYRTEC ALLERGY) 10  MG tablet Take 1 tablet (10 mg total) by mouth daily. 08/18/14  Yes [name], PA-C  Fe Fum-Fe Poly-Vit C-Lactobac (FUSION) 65-65-25-30 MG CAPS Take 1 tablet by mouth daily.   Yes Historical Provider, MD  lisinopril-hydrochlorothiazide (PRINZIDE,ZESTORETIC) 20-12.5 MG per tablet Take 1 tablet by mouth every morning.    Yes Historical Provider, MD  metFORMIN (GLUCOPHAGE) 500 MG tablet Take 500 mg by mouth 2 (two) times daily with a meal.   Yes Historical Provider, MD  omeprazole (PRILOSEC) 20 MG capsule Take 40 mg by mouth daily.   Yes Historical Provider, MD    Family History Family History  Problem Relation Age of Onset  . Cancer Mother     Pancreatic Cancer    Social History Social History  Substance Use Topics  . Smoking status: Current Every Day Smoker  . Smokeless tobacco: Never Used  . Alcohol use No     Comment: occ     Allergies   Review of patient's allergies indicates no known allergies.   Review of Systems Review of Systems  Review of Systems All other systems negative except as documented in the HPI. All pertinent positives and negatives as reviewed in the HPI.   Physical Exam Updated Vital Signs BP 151/99   Pulse 73   Temp 98.1 F (36.7 C) (Oral)   Resp 20   Ht 5\' 2"  (1.575 m)   Wt 114.3 kg   SpO2 100%   BMI 46.09 kg/m  Physical Exam  Constitutional: She appears well-developed and well-nourished.  HENT:  Head: Normocephalic and atraumatic.  Eyes: Conjunctivae are normal. Pupils are equal, round, and reactive to light.  Neck: Trachea normal, normal range of motion and full passive range of motion without pain. Neck supple.  Cardiovascular: Normal rate, regular rhythm and normal pulses.   Pulmonary/Chest: Effort normal. She has wheezes. Chest wall is not dull to percussion. She exhibits no tenderness, no crepitus, no edema, no deformity and no retraction.  Abdominal: Soft. Normal appearance and bowel sounds are normal.  Musculoskeletal: Normal  range of motion.  Neurological: She is alert. She has normal strength.  Skin: Skin is warm, dry and intact.  Psychiatric: She has a normal mood and affect. Her speech is normal and behavior is normal. Judgment and thought content normal. Cognition and memory are normal.  Nursing note and vitals reviewed.    ED Treatments / Results  Labs (all labs ordered are listed, but only abnormal results are displayed) Labs Reviewed - No data to display  EKG  EKG Interpretation None       Radiology Dg Chest 2 View  Result Date: 10/01/2015 CLINICAL DATA:  Shortness of breath after cleaning bathroom with for rocks today. Cough and chest congestion. History of hypertension, diabetes, smoker, COPD. EXAM: CHEST  2 VIEW COMPARISON:  09/18/2013 FINDINGS: Mild cardiac enlargement without vascular congestion or edema. No focal consolidation in the lungs. No blunting of costophrenic angles. No pneumothorax. Mediastinal contours appear intact. Surgical clips in the upper abdomen.  IMPRESSION: No active cardiopulmonary disease. Electronically Signed   By: Lucienne Capers M.D.   On: 10/01/2015 23:46    Procedures Procedures (including critical care time)  Medications Ordered in ED Medications  albuterol (PROVENTIL HFA;VENTOLIN HFA) 108 (90 Base) MCG/ACT inhaler 2 puff (2 puffs Inhalation Given 10/02/15 0241)  albuterol (PROVENTIL) (2.5 MG/3ML) 0.083% nebulizer solution 5 mg (5 mg Nebulization Given 10/02/15 0224)  ipratropium (ATROVENT) nebulizer solution 0.5 mg (0.5 mg Nebulization Given 10/02/15 0224)  predniSONE (DELTASONE) tablet 60 mg (60 mg Oral Given 10/02/15 0240)   Initial Impression / Assessment and Plan / ED Course  I have reviewed the triage vital signs and the nursing notes.  Pertinent labs & imaging results that were available during my care of the patient were reviewed by me and considered in my medical decision making (see chart for details).  Clinical Course   Patients exam is mild, she  is well appearing with normal oxygen saturation. Normal effort- given breathing tx in the ED and an albuterol inahler for home. She does endorse some relief.   I discussed results, diagnoses and plan with [facility]. They voice there understanding and questions were answered. We discussed follow-up recommendations and return precautions.  Final Clinical Impressions(s) / ED Diagnoses   Final diagnoses:  Wheezing   New Prescriptions New Prescriptions   No medications on file     Delos Haring, PA-C 10/02/15 [id number]    Veryl Speak, MD 10/02/15 [phone]

## 2015-11-22 ENCOUNTER — Emergency Department (HOSPITAL_COMMUNITY): Payer: Medicaid Other

## 2015-11-22 ENCOUNTER — Encounter (HOSPITAL_COMMUNITY): Payer: Self-pay | Admitting: Emergency Medicine

## 2015-11-22 ENCOUNTER — Emergency Department (HOSPITAL_COMMUNITY)
Admission: EM | Admit: 2015-11-22 | Discharge: 2015-11-22 | Disposition: A | Discharge status: Home / Self Care | Payer: Medicaid Other | Attending: Emergency Medicine | Admitting: Emergency Medicine

## 2015-11-22 DIAGNOSIS — N938 Other specified abnormal uterine and vaginal bleeding: Secondary | ICD-10-CM | POA: Diagnosis not present

## 2015-11-22 DIAGNOSIS — E119 Type 2 diabetes mellitus without complications: Secondary | ICD-10-CM | POA: Insufficient documentation

## 2015-11-22 DIAGNOSIS — J45909 Unspecified asthma, uncomplicated: Secondary | ICD-10-CM | POA: Insufficient documentation

## 2015-11-22 DIAGNOSIS — Z79899 Other long term (current) drug therapy: Secondary | ICD-10-CM | POA: Diagnosis not present

## 2015-11-22 DIAGNOSIS — I10 Essential (primary) hypertension: Secondary | ICD-10-CM | POA: Diagnosis not present

## 2015-11-22 DIAGNOSIS — N939 Abnormal uterine and vaginal bleeding, unspecified: Secondary | ICD-10-CM

## 2015-11-22 DIAGNOSIS — Z7984 Long term (current) use of oral hypoglycemic drugs: Secondary | ICD-10-CM | POA: Diagnosis not present

## 2015-11-22 DIAGNOSIS — F17200 Nicotine dependence, unspecified, uncomplicated: Secondary | ICD-10-CM | POA: Insufficient documentation

## 2015-11-22 LAB — CBC WITH DIFFERENTIAL/PLATELET
Basophils Absolute: 0 10*3/uL (ref 0.0–0.1)
Basophils Relative: 0 %
Eosinophils Absolute: 0.2 10*3/uL (ref 0.0–0.7)
Eosinophils Relative: 2 %
HCT: 31.7 % — ABNORMAL LOW (ref 36.0–46.0)
Hemoglobin: 9.4 g/dL — ABNORMAL LOW (ref 12.0–15.0)
Lymphocytes Relative: 32 %
Lymphs Abs: 2.8 10*3/uL (ref 0.7–4.0)
MCH: 23.1 pg — ABNORMAL LOW (ref 26.0–34.0)
MCHC: 29.7 g/dL — ABNORMAL LOW (ref 30.0–36.0)
MCV: 77.9 fL — ABNORMAL LOW (ref 78.0–100.0)
Monocytes Absolute: 0.5 10*3/uL (ref 0.1–1.0)
Monocytes Relative: 5 %
Neutro Abs: 5.4 10*3/uL (ref 1.7–7.7)
Neutrophils Relative %: 61 %
Platelets: 437 10*3/uL — ABNORMAL HIGH (ref 150–400)
RBC: 4.07 MIL/uL (ref 3.87–5.11)
RDW: 18.5 % — ABNORMAL HIGH (ref 11.5–15.5)
WBC: 8.9 10*3/uL (ref 4.0–10.5)

## 2015-11-22 LAB — WET PREP, GENITAL
Clue Cells Wet Prep HPF POC: NONE SEEN
Sperm: NONE SEEN
Trich, Wet Prep: NONE SEEN
Yeast Wet Prep HPF POC: NONE SEEN

## 2015-11-22 LAB — URINALYSIS, ROUTINE W REFLEX MICROSCOPIC
Bilirubin Urine: NEGATIVE
Glucose, UA: >1000 mg/dL — AB
Ketones, ur: NEGATIVE mg/dL
Leukocytes, UA: NEGATIVE
Nitrite: NEGATIVE
Protein, ur: NEGATIVE mg/dL
Specific Gravity, Urine: 1.019 (ref 1.005–1.030)
pH: 6 (ref 5.0–8.0)

## 2015-11-22 LAB — URINE MICROSCOPIC-ADD ON

## 2015-11-22 LAB — BASIC METABOLIC PANEL
Anion gap: 8 (ref 5–15)
BUN: 11 mg/dL (ref 6–20)
CO2: 26 mmol/L (ref 22–32)
Calcium: 8.9 mg/dL (ref 8.9–10.3)
Chloride: 104 mmol/L (ref 101–111)
Creatinine, Ser: 0.7 mg/dL (ref 0.44–1.00)
GFR calc Af Amer: >60 mL/min (ref >60)
GFR calc non Af Amer: >60 mL/min (ref >60)
Glucose, Bld: 133 mg/dL — ABNORMAL HIGH (ref 65–99)
Potassium: 3.4 mmol/L — ABNORMAL LOW (ref 3.5–5.1)
Sodium: 138 mmol/L (ref 135–145)

## 2015-11-22 LAB — TYPE AND SCREEN
ABO/RH(D): O POS
Antibody Screen: NEGATIVE

## 2015-11-22 LAB — ABO/RH: ABO/RH(D): O POS

## 2015-11-22 LAB — POC URINE PREG, ED: Preg Test, Ur: NEGATIVE

## 2015-11-22 MED ORDER — MEDROXYPROGESTERONE ACETATE 5 MG PO TABS: 5.0000 mg | ORAL_TABLET | Freq: Every day | ORAL | 0 refills | Status: DC

## 2015-11-22 NOTE — ED Triage Notes (Signed)
Has had heavy vag bleed and cramping x 3 weeks

## 2015-11-22 NOTE — ED Notes (Signed)
Patient reports starting her menstrual cycle on 10/10. Reports having intercourse that same day, and after intercourse cramping and bleeding became severe. Reports ever since "it has felt like something is in my stomach". Reports heavy bleeding for 5 days but bleeding has mostly subsided but she continues to pass clots which is abnormal for her.

## 2015-11-22 NOTE — ED Notes (Signed)
Pt transported to [facility]

## 2015-11-22 NOTE — Discharge Instructions (Signed)
Medications: Provera  Treatment: Take Provera for 5 days to stop bleeding. You can take ibuprofen as prescribed over-the-counter as needed for your pain. You will be called in 2-3 days if any of your results return positive. If any of your results return positive, please follow-up with your primary care provider or health department for treatment.  Follow-up: Please follow-up with the [facility] as soon as possible for further evaluation and treatment of your symptoms. Please go to emergency department at [facility] if you develop any new or worsening symptoms.

## 2015-11-22 NOTE — ED Provider Notes (Signed)
[place] [facility] DEPT Provider Note   CSN: [id number] Arrival date & time: 11/22/15  1044     History   Chief Complaint Chief Complaint  Patient presents with  . Vaginal Bleeding    HPI Crystal Brewer is a 40 y.o. female who presents with a 2 week history of vaginal bleeding. Patient reports she began her period on 10/10. She had intercourse on that day with some pain. Immediately after, patient began having cramping on her left lower abdomen. This is now resolved. Patient has been feeling greater than 5 tampons a day and passing some large clots. Patient has had associated nausea, but no vomiting. Patient has also had some associated urinary frequency. Patient denies any vaginal discharge, other than the bleeding. Patient denies any pain at this time. Patient has not taken any medications for this. Patient denies any chest pain, shortness of breath, abdominal pain, fevers.  HPI  Past Medical History:  Diagnosis Date  . Anemia   . Asthma   . Bleeding disorder ([place])    "UNKNOWN PROBLEM" has not seen "blood doctor "yet  . Diabetes mellitus without complication ([place])   . Gallstones   . GERD (gastroesophageal reflux disease)   . Headache(784.0)   . Hx of degenerative disc disease   . Hypertension   . Obesity   . Thyroid disease     Patient Active Problem List   Diagnosis Date Noted  . Cholecystitis with cholelithiasis 09/26/2013  . Cholelithiasis with cholecystitis 09/02/2013    Past Surgical History:  Procedure Laterality Date  . CHOLECYSTECTOMY N/A 09/26/2013   Procedure: LAPAROSCOPIC CHOLECYSTECTOMY WITH INTRAOPERATIVE CHOLANGIOGRAM;  Surgeon: Earnstine Regal, MD;  Location: [facility];  Service: General;  Laterality: N/A;    OB History    No data available       Home Medications    Prior to Admission medications   Medication Sig Start Date End Date Taking? Authorizing Provider  albuterol (PROVENTIL HFA;VENTOLIN HFA) 108 (90 BASE) MCG/ACT inhaler Inhale 2 puffs into the  lungs every 4 (four) hours as needed for wheezing or shortness of breath.   Yes Historical Provider, MD  canagliflozin (INVOKANA) 100 MG TABS tablet Take 100 mg by mouth daily.   Yes Historical Provider, MD  cetirizine (ZYRTEC ALLERGY) 10 MG tablet Take 1 tablet (10 mg total) by mouth daily. 08/18/14  Yes [name], PA-C  Fe Fum-Fe Poly-Vit C-Lactobac (FUSION) 65-65-25-30 MG CAPS Take 1 tablet by mouth daily.   Yes Historical Provider, MD  ibuprofen (ADVIL,MOTRIN) 200 MG tablet Take 200 mg by mouth every 6 (six) hours as needed for moderate pain.   Yes Historical Provider, MD  lisinopril-hydrochlorothiazide (PRINZIDE,ZESTORETIC) 20-12.5 MG per tablet Take 1 tablet by mouth every morning.    Yes Historical Provider, MD  metFORMIN (GLUCOPHAGE) 500 MG tablet Take 500 mg by mouth 2 (two) times daily with a meal.   Yes Historical Provider, MD  omeprazole (PRILOSEC) 20 MG capsule Take 40 mg by mouth daily.   Yes Historical Provider, MD  medroxyPROGESTERone (PROVERA) 5 MG tablet Take 1 tablet (5 mg total) by mouth daily. 11/22/15   Frederica Kuster, PA-C    Family History Family History  Problem Relation Age of Onset  . Cancer Mother     Pancreatic Cancer    Social History Social History  Substance Use Topics  . Smoking status: Current Every Day Smoker  . Smokeless tobacco: Never Used  . Alcohol use No     Comment: occ  Allergies   Review of patient's allergies indicates no known allergies.   Review of Systems Review of Systems  Constitutional: Negative for chills and fever.  HENT: Negative for facial swelling and sore throat.   Respiratory: Negative for shortness of breath.   Cardiovascular: Negative for chest pain.  Gastrointestinal: Negative for abdominal pain, nausea and vomiting.  Genitourinary: Positive for frequency and vaginal bleeding. Negative for dysuria.  Musculoskeletal: Negative for back pain.  Skin: Negative for rash and wound.  Neurological: Negative for  headaches.  Psychiatric/Behavioral: The patient is not nervous/anxious.      Physical Exam Updated Vital Signs BP 138/83 (BP Location: Right Wrist)   Pulse 91   Temp 98.2 F (36.8 C) (Oral)   Resp 18   SpO2 98%   Physical Exam  Constitutional: She appears well-developed and well-nourished. No distress.  HENT:  Head: Normocephalic and atraumatic.  Mouth/Throat: Oropharynx is clear and moist. No oropharyngeal exudate.  Eyes: Conjunctivae are normal. Pupils are equal, round, and reactive to light. Right eye exhibits no discharge. Left eye exhibits no discharge. No scleral icterus.  Neck: Normal range of motion. Neck supple. No thyromegaly present.  Cardiovascular: Normal rate, regular rhythm, normal heart sounds and intact distal pulses.  Exam reveals no gallop and no friction rub.   No murmur heard. Pulmonary/Chest: Effort normal and breath sounds normal. No stridor. No respiratory distress. She has no wheezes. She has no rales.  Abdominal: Soft. Bowel sounds are normal. She exhibits no distension. There is no tenderness. There is no rebound and no guarding.  Musculoskeletal: She exhibits no edema.  Lymphadenopathy:    She has no cervical adenopathy.  Neurological: She is alert. Coordination normal.  Skin: Skin is warm and dry. No rash noted. She is not diaphoretic. No pallor.  Psychiatric: She has a normal mood and affect.  Nursing note and vitals reviewed.    ED Treatments / Results  Labs (all labs ordered are listed, but only abnormal results are displayed) Labs Reviewed  WET PREP, GENITAL - Abnormal; Notable for the following:       Result Value   WBC, Wet Prep HPF POC FEW (*)    All other components within normal limits  BASIC METABOLIC PANEL - Abnormal; Notable for the following:    Potassium 3.4 (*)    Glucose, Bld 133 (*)    All other components within normal limits  CBC WITH DIFFERENTIAL/PLATELET - Abnormal; Notable for the following:    Hemoglobin 9.4 (*)     HCT 31.7 (*)    MCV 77.9 (*)    MCH 23.1 (*)    MCHC 29.7 (*)    RDW 18.5 (*)    Platelets 437 (*)    All other components within normal limits  URINALYSIS, ROUTINE W REFLEX MICROSCOPIC (NOT AT [facility]) - Abnormal; Notable for the following:    Glucose, UA >1000 (*)    Hgb urine dipstick LARGE (*)    All other components within normal limits  URINE MICROSCOPIC-ADD ON - Abnormal; Notable for the following:    Squamous Epithelial / LPF 0-5 (*)    Bacteria, UA RARE (*)    All other components within normal limits  RPR  HIV ANTIBODY (ROUTINE TESTING)  POC URINE PREG, ED  TYPE AND SCREEN  ABO/RH  GC/CHLAMYDIA PROBE AMP ([place]) NOT AT [facility]    EKG  EKG Interpretation None       Radiology US Transvaginal Non-ob  Result Date: 11/22/2015 CLINICAL DATA:  Vaginal bleeding. EXAM: TRANSABDOMINAL AND TRANSVAGINAL ULTRASOUND OF PELVIS TECHNIQUE: Both transabdominal and transvaginal ultrasound examinations of the pelvis were performed. Transabdominal technique was performed for global imaging of the pelvis including uterus, ovaries, adnexal regions, and pelvic cul-de-sac. It was necessary to proceed with endovaginal exam following the transabdominal exam to visualize the uterus and ovaries. COMPARISON:  No recent prior. FINDINGS: Uterus Measurements: 10.7 x 7.0 x 8.6 cm. 1.3 cm fibroid anterior uterine body. Endometrium Thickness: 24 mm.  Endometrium is thickened and heterogeneous. Right ovary Measurements: 3.6 x 2.7 x 2.4 cm. 2.8 x 2.4 x 1.5 cm simple cyst scratched. Left ovary Measurements: 2.7 x 1.6 x 1.2 cm. 0.9 x 0.8 x 0.7 cm simple cyst. Other findings No abnormal free fluid. IMPRESSION: Prominent thickening of the endometrium at 24 mm. If bleeding remains unresponsive to hormonal or medical therapy, focal lesion work-up with sonohysterogram should be considered. Endometrial biopsy should also be considered in pre-menopausal patients at high risk for endometrial carcinoma. (Ref:  Radiological Reasoning: Algorithmic Workup of Abnormal Vaginal Bleeding with Endovaginal Sonography and Sonohysterography. AJR 2008; [id number]) Electronically Signed   By: Marcello Moores  Register   On: 11/22/2015 17:19   US Pelvis Complete  Result Date: 11/22/2015 CLINICAL DATA:  Vaginal bleeding. EXAM: TRANSABDOMINAL AND TRANSVAGINAL ULTRASOUND OF PELVIS TECHNIQUE: Both transabdominal and transvaginal ultrasound examinations of the pelvis were performed. Transabdominal technique was performed for global imaging of the pelvis including uterus, ovaries, adnexal regions, and pelvic cul-de-sac. It was necessary to proceed with endovaginal exam following the transabdominal exam to visualize the uterus and ovaries. COMPARISON:  No recent prior. FINDINGS: Uterus Measurements: 10.7 x 7.0 x 8.6 cm. 1.3 cm fibroid anterior uterine body. Endometrium Thickness: 24 mm.  Endometrium is thickened and heterogeneous. Right ovary Measurements: 3.6 x 2.7 x 2.4 cm. 2.8 x 2.4 x 1.5 cm simple cyst scratched. Left ovary Measurements: 2.7 x 1.6 x 1.2 cm. 0.9 x 0.8 x 0.7 cm simple cyst. Other findings No abnormal free fluid. IMPRESSION: Prominent thickening of the endometrium at 24 mm. If bleeding remains unresponsive to hormonal or medical therapy, focal lesion work-up with sonohysterogram should be considered. Endometrial biopsy should also be considered in pre-menopausal patients at high risk for endometrial carcinoma. (Ref: Radiological Reasoning: Algorithmic Workup of Abnormal Vaginal Bleeding with Endovaginal Sonography and Sonohysterography. AJR 2008; [id number]) Electronically Signed   By: Marcello Moores  Register   On: 11/22/2015 17:19    Procedures Procedures (including critical care time)  Medications Ordered in ED Medications - No data to display   Initial Impression / Assessment and Plan / ED Course  I have reviewed the triage vital signs and the nursing notes.  Pertinent labs & imaging results that were available during  my care of the patient were reviewed by me and considered in my medical decision making (see chart for details).  Clinical Course    CBC shows hemoglobin 9.4. BMP shows potassium 3.4. Urine pregnancy negative. UA shows large hematuria, >1000 glucose, rare bacteria. Wet prep shows few WBCs. GC/chlamydia, HIV, RPR pending. Patient would like to wait for results for treatment. She was advised she'll be called in 2-3 days. Pelvic ultrasound shows [Prominent thickening of the endometrium at 24 mm. If bleeding remains unresponsive to hormonal or medical therapy, focal lesion work-up with sonohysterogram should be considered. Endometrial biopsy should also be considered in pre-menopausal patients at high risk for endometrial carcinoma.] Patient discharged home with Provera with follow-up to women's outpatient clinic. Return precautions discussed. Patient advised to follow-up at the  [facility] emergency department if she develops any new or worsening symptoms regarding her complaint. Patient understands and agrees with plan. Patient vitals stable throughout ED course and discharged in satisfactory condition.  Final Clinical Impressions(s) / ED Diagnoses   Final diagnoses:  Vaginal bleeding    New Prescriptions New Prescriptions   MEDROXYPROGESTERONE (PROVERA) 5 MG TABLET    Take 1 tablet (5 mg total) by mouth daily.     Frederica Kuster, PA-C 11/22/15 1817    Fredia Sorrow, MD 11/23/15 [phone]

## 2015-11-23 LAB — HIV ANTIBODY (ROUTINE TESTING W REFLEX): HIV Screen 4th Generation wRfx: NONREACTIVE

## 2015-11-23 LAB — GC/CHLAMYDIA PROBE AMP (~~LOC~~) NOT AT ARMC
Chlamydia: POSITIVE — AB
Neisseria Gonorrhea: NEGATIVE

## 2015-11-23 LAB — RPR: RPR Ser Ql: NONREACTIVE

## 2015-11-26 ENCOUNTER — Telehealth (HOSPITAL_BASED_OUTPATIENT_CLINIC_OR_DEPARTMENT_OTHER): Payer: Self-pay | Admitting: *Deleted

## 2015-11-26 NOTE — Telephone Encounter (Signed)
Spoke with patient, verified ID, informed of labs. Patient states she [facility].

## 2015-12-15 ENCOUNTER — Encounter (HOSPITAL_COMMUNITY): Payer: Self-pay

## 2015-12-15 ENCOUNTER — Emergency Department (HOSPITAL_COMMUNITY)
Admission: EM | Admit: 2015-12-15 | Discharge: 2015-12-15 | Disposition: A | Discharge status: Home / Self Care | Payer: Medicaid Other | Attending: Emergency Medicine | Admitting: Emergency Medicine

## 2015-12-15 DIAGNOSIS — J45909 Unspecified asthma, uncomplicated: Secondary | ICD-10-CM | POA: Insufficient documentation

## 2015-12-15 DIAGNOSIS — Z7984 Long term (current) use of oral hypoglycemic drugs: Secondary | ICD-10-CM | POA: Diagnosis not present

## 2015-12-15 DIAGNOSIS — R319 Hematuria, unspecified: Secondary | ICD-10-CM

## 2015-12-15 DIAGNOSIS — N390 Urinary tract infection, site not specified: Secondary | ICD-10-CM | POA: Diagnosis not present

## 2015-12-15 DIAGNOSIS — E119 Type 2 diabetes mellitus without complications: Secondary | ICD-10-CM | POA: Insufficient documentation

## 2015-12-15 DIAGNOSIS — F17200 Nicotine dependence, unspecified, uncomplicated: Secondary | ICD-10-CM | POA: Diagnosis not present

## 2015-12-15 DIAGNOSIS — I10 Essential (primary) hypertension: Secondary | ICD-10-CM | POA: Diagnosis not present

## 2015-12-15 DIAGNOSIS — N946 Dysmenorrhea, unspecified: Secondary | ICD-10-CM | POA: Diagnosis not present

## 2015-12-15 LAB — CBC
HEMATOCRIT: 33.9 % — AB (ref 36.0–46.0)
Hemoglobin: 9.9 g/dL — ABNORMAL LOW (ref 12.0–15.0)
MCH: 22.1 pg — AB (ref 26.0–34.0)
MCHC: 29.2 g/dL — AB (ref 30.0–36.0)
MCV: 75.7 fL — AB (ref 78.0–100.0)
PLATELETS: 502 10*3/uL — AB (ref 150–400)
RBC: 4.48 MIL/uL (ref 3.87–5.11)
RDW: 18.2 % — AB (ref 11.5–15.5)
WBC: 11.8 10*3/uL — ABNORMAL HIGH (ref 4.0–10.5)

## 2015-12-15 LAB — URINALYSIS, ROUTINE W REFLEX MICROSCOPIC
Glucose, UA: >1000 mg/dL — AB
Ketones, ur: 15 mg/dL — AB
Nitrite: NEGATIVE
PH: 5 (ref 5.0–8.0)
Protein, ur: 100 mg/dL — AB
SPECIFIC GRAVITY, URINE: 1.039 — AB (ref 1.005–1.030)

## 2015-12-15 LAB — WET PREP, GENITAL
Clue Cells Wet Prep HPF POC: NONE SEEN
SPERM: NONE SEEN
Trich, Wet Prep: NONE SEEN
YEAST WET PREP: NONE SEEN

## 2015-12-15 LAB — URINE MICROSCOPIC-ADD ON

## 2015-12-15 LAB — I-STAT BETA HCG BLOOD, ED (MC, WL, AP ONLY): I-stat hCG, quantitative: <5 m[IU]/mL (ref <5)

## 2015-12-15 MED ORDER — CEFTRIAXONE SODIUM 250 MG IJ SOLR
250.0000 mg | Freq: Once | INTRAMUSCULAR | Status: AC
  Administered 2015-12-15: 250 mg via INTRAMUSCULAR
  Filled 2015-12-15: qty 250

## 2015-12-15 MED ORDER — LIDOCAINE HCL (PF) 1 % IJ SOLN
INTRAMUSCULAR | Status: AC
  Administered 2015-12-15: 5 mL
  Filled 2015-12-15: qty 5

## 2015-12-15 MED ORDER — NAPROXEN 500 MG PO TABS: 500.0000 mg | ORAL_TABLET | Freq: Two times a day (BID) | ORAL | 0 refills | Status: DC

## 2015-12-15 MED ORDER — CEPHALEXIN 500 MG PO CAPS: 500.0000 mg | ORAL_CAPSULE | Freq: Three times a day (TID) | ORAL | 0 refills | Status: AC

## 2015-12-15 MED ORDER — AZITHROMYCIN 250 MG PO TABS
1000.0000 mg | ORAL_TABLET | Freq: Once | ORAL | Status: AC
  Administered 2015-12-15: 1000 mg via ORAL
  Filled 2015-12-15: qty 4

## 2015-12-15 MED ORDER — KETOROLAC TROMETHAMINE 60 MG/2ML IM SOLN
60.0000 mg | Freq: Once | INTRAMUSCULAR | Status: AC
  Administered 2015-12-15: 60 mg via INTRAMUSCULAR
  Filled 2015-12-15: qty 2

## 2015-12-15 NOTE — ED Triage Notes (Addendum)
Patient complains of lower pelvic pain with vaginal bleeding with clots. States that she has this often with back pain. Alert and oriented, NAD

## 2015-12-15 NOTE — ED Provider Notes (Signed)
[place] DEPT Provider Note   CSN: [id number] Arrival date & time: 12/15/15  1317     History   Chief Complaint Chief Complaint  Patient presents with  . Pelvic Pain/cramps    HPI Crystal Brewer is a 40 y.o. female.  HPI   40yo female with history of ergency department visit one month ago concerning menstrual cramps and bleeding and presents with concerns for a severe cramping pain and vaginal bleeding. Patient denies significant bleeding.  Reports she normally passes large clots, however today has small clots, and denies using many pads.  Reports severe pelvic cramping, no radiation of pain. No other vaginal dischrge. Reports she was treated for chlamydia at health department and has not had intercourse since then but is interested in retesting and treatment.  Denies fevers, diarrhea, vomiting.  [facility]. Reports her menses used to not be painful however have been the last 2 times and would like to know why.  Past Medical History:  Diagnosis Date  . Anemia   . Asthma   . Bleeding disorder ([place])    "UNKNOWN PROBLEM" has not seen "blood doctor "yet  . Diabetes mellitus without complication ([place])   . Gallstones   . GERD (gastroesophageal reflux disease)   . Headache(784.0)   . Hx of degenerative disc disease   . Hypertension   . Obesity   . Thyroid disease     Patient Active Problem List   Diagnosis Date Noted  . Cholecystitis with cholelithiasis 09/26/2013  . Cholelithiasis with cholecystitis 09/02/2013    Past Surgical History:  Procedure Laterality Date  . CHOLECYSTECTOMY N/A 09/26/2013   Procedure: LAPAROSCOPIC CHOLECYSTECTOMY WITH INTRAOPERATIVE CHOLANGIOGRAM;  Surgeon: Earnstine Regal, MD;  Location: [facility];  Service: General;  Laterality: N/A;    OB History    No data available       Home Medications    Prior to Admission medications   Medication Sig Start Date End Date Taking? Authorizing Provider  albuterol (PROVENTIL  HFA;VENTOLIN HFA) 108 (90 BASE) MCG/ACT inhaler Inhale 2 puffs into the lungs every 4 (four) hours as needed for wheezing or shortness of breath.    Historical Provider, MD  canagliflozin (INVOKANA) 100 MG TABS tablet Take 100 mg by mouth daily.    Historical Provider, MD  cephALEXin (KEFLEX) 500 MG capsule Take 1 capsule (500 mg total) by mouth 3 (three) times daily. 12/15/15 12/22/15  [name], MD  cetirizine (ZYRTEC ALLERGY) 10 MG tablet Take 1 tablet (10 mg total) by mouth daily. 08/18/14   [name], PA-C  Fe Fum-Fe Poly-Vit C-Lactobac (FUSION) 65-65-25-30 MG CAPS Take 1 tablet by mouth daily.    Historical Provider, MD  ibuprofen (ADVIL,MOTRIN) 200 MG tablet Take 200 mg by mouth every 6 (six) hours as needed for moderate pain.    Historical Provider, MD  lisinopril-hydrochlorothiazide (PRINZIDE,ZESTORETIC) 20-12.5 MG per tablet Take 1 tablet by mouth every morning.     Historical Provider, MD  medroxyPROGESTERone (PROVERA) 5 MG tablet Take 1 tablet (5 mg total) by mouth daily. 11/22/15   Frederica Kuster, PA-C  metFORMIN (GLUCOPHAGE) 500 MG tablet Take 500 mg by mouth 2 (two) times daily with a meal.    Historical Provider, MD  naproxen (NAPROSYN) 500 MG tablet Take 1 tablet (500 mg total) by mouth 2 (two) times daily with a meal. 12/15/15   [name], MD  omeprazole (PRILOSEC) 20 MG capsule Take 40 mg by mouth daily.    Historical Provider,  MD    Family History Family History  Problem Relation Age of Onset  . Cancer Mother     Pancreatic Cancer    Social History Social History  Substance Use Topics  . Smoking status: Current Every Day Smoker  . Smokeless tobacco: Never Used  . Alcohol use No     Comment: occ     Allergies   Patient has no known allergies.   Review of Systems Review of Systems  Constitutional: Negative for fever.  HENT: Negative for sore throat.   Eyes: Negative for visual disturbance.  Respiratory: Negative for cough and shortness of  breath.   Cardiovascular: Negative for chest pain.  Gastrointestinal: Negative for abdominal pain, nausea and vomiting.  Genitourinary: Positive for menstrual problem, pelvic pain and vaginal bleeding. Negative for difficulty urinating. Dyspareunia: reports no intercourse. Dysuria: pressure with urinating.  Musculoskeletal: Negative for back pain and neck pain.  Skin: Negative for rash.  Neurological: Negative for syncope and headaches.     Physical Exam Updated Vital Signs BP 102/68 (BP Location: Right Arm)   Pulse 92   Temp 98.2 F (36.8 C) (Oral)   Resp 18   SpO2 99%   Physical Exam  Constitutional: She is oriented to person, place, and time. She appears well-developed and well-nourished. No distress.  HENT:  Head: Normocephalic and atraumatic.  Eyes: Conjunctivae and EOM are normal.  Neck: Normal range of motion.  Cardiovascular: Normal rate, regular rhythm, normal heart sounds and intact distal pulses.  Exam reveals no gallop and no friction rub.   No murmur heard. Pulmonary/Chest: Effort normal and breath sounds normal. No respiratory distress. She has no wheezes. She has no rales.  Abdominal: Soft. She exhibits no distension. There is no tenderness. There is no guarding.  Genitourinary: Uterus is tender. Cervix exhibits motion tenderness. Cervix exhibits no discharge. Right adnexum displays tenderness. Left adnexum displays tenderness. There is bleeding (scant) in the vagina.  Musculoskeletal: She exhibits no edema or tenderness.  Neurological: She is alert and oriented to person, place, and time.  Skin: Skin is warm and dry. No rash noted. She is not diaphoretic. No erythema.  Nursing note and vitals reviewed.    ED Treatments / Results  Labs (all labs ordered are listed, but only abnormal results are displayed) Labs Reviewed  WET PREP, GENITAL - Abnormal; Notable for the following:       Result Value   WBC, Wet Prep HPF POC MODERATE (*)    All other components  within normal limits  CBC - Abnormal; Notable for the following:    WBC 11.8 (*)    Hemoglobin 9.9 (*)    HCT 33.9 (*)    MCV 75.7 (*)    MCH 22.1 (*)    MCHC 29.2 (*)    RDW 18.2 (*)    Platelets 502 (*)    All other components within normal limits  URINALYSIS, ROUTINE W REFLEX MICROSCOPIC (NOT AT [facility]) - Abnormal; Notable for the following:    Color, Urine RED (*)    APPearance TURBID (*)    Specific Gravity, Urine 1.039 (*)    Glucose, UA >1000 (*)    Hgb urine dipstick LARGE (*)    Bilirubin Urine MODERATE (*)    Ketones, ur 15 (*)    Protein, ur 100 (*)    Leukocytes, UA MODERATE (*)    All other components within normal limits  URINE MICROSCOPIC-ADD ON - Abnormal; Notable for the following:    Squamous  Epithelial / LPF 0-5 (*)    Bacteria, UA FEW (*)    All other components within normal limits  I-STAT BETA HCG BLOOD, ED ([facility], [facility], [facility] ONLY)  GC/CHLAMYDIA PROBE AMP ([place]) NOT AT [facility]    EKG  EKG Interpretation None       Radiology No results found.  Procedures Procedures (including critical care time)  Medications Ordered in ED Medications  cefTRIAXone (ROCEPHIN) injection 250 mg (250 mg Intramuscular Given 12/15/15 1614)  azithromycin (ZITHROMAX) tablet 1,000 mg (1,000 mg Oral Given 12/15/15 1611)  ketorolac (TORADOL) injection 60 mg (60 mg Intramuscular Given 12/15/15 1614)  lidocaine (PF) (XYLOCAINE) 1 % injection (5 mLs  Given 12/15/15 1614)     Initial Impression / Assessment and Plan / ED Course  I have reviewed the triage vital signs and the nursing notes.  Pertinent labs & imaging results that were available during my care of the patient were reviewed by me and considered in my medical decision making (see chart for details).  Clinical Course     41yo female with history of ergency department visit one month ago concerning menstrual cramps and bleeding and presents with concerns for a severe cramping pain and vaginal bleeding. Patient  denies significant bleeding, hemoglobin is at baseline, and do not feel that progestin is indicated at this time. Patient had tested positive for chlamydia at last visit, and was treated at the health department.  Pelvic exam today which showed some vaginal bleeding, without find a foreign body or trauma. Patient reported she would like empiric treatment for gonorrhea chlamydia. Given her cramping is present only when she's menstruating, I do not feel that she requires full treatment for PID. Have low suspicion for tubovarian abscess and by history, exam, doubt appendicitis or diverticulitis. UPreg negative.    Patient most likely has dysmenorrhea. Reports this is new for her, and given this as well as prior ultrasound findings, recommend follow up with OB/GYN as an outpatient as scheduled.  Given Toradol in the emergency department, and a prescription for naproxen. Urinalysis may be contaminated by menstrual blood however she does describe some pressure with urination and will treat for UTI with 1 wk of keflex.   Final Clinical Impressions(s) / ED Diagnoses   Final diagnoses:  Menstrual cramps  Urinary tract infection with hematuria, site unspecified, unknown if menstrual bleeding or hematuria    New Prescriptions Discharge Medication List as of 12/15/2015  5:23 PM    START taking these medications   Details  cephALEXin (KEFLEX) 500 MG capsule Take 1 capsule (500 mg total) by mouth 3 (three) times daily., Starting Wed 12/15/2015, Until Wed 12/22/2015, Print    naproxen (NAPROSYN) 500 MG tablet Take 1 tablet (500 mg total) by mouth 2 (two) times daily with a meal., Starting Wed 12/15/2015, Print         [name], MD 12/16/15 1002

## 2015-12-16 LAB — GC/CHLAMYDIA PROBE AMP (~~LOC~~) NOT AT ARMC
CHLAMYDIA, DNA PROBE: NEGATIVE
NEISSERIA GONORRHEA: NEGATIVE

## 2015-12-30 ENCOUNTER — Encounter: Payer: Medicaid Other | Admitting: Family Medicine

## 2016-01-21 ENCOUNTER — Encounter: Payer: Medicaid Other | Admitting: Obstetrics & Gynecology

## 2016-02-09 ENCOUNTER — Other Ambulatory Visit: Payer: Self-pay | Admitting: Internal Medicine

## 2016-02-09 DIAGNOSIS — Z1231 Encounter for screening mammogram for malignant neoplasm of breast: Secondary | ICD-10-CM

## 2016-02-10 ENCOUNTER — Encounter: Payer: Medicaid Other | Admitting: Obstetrics & Gynecology

## 2016-02-21 ENCOUNTER — Ambulatory Visit: Payer: Medicaid Other

## 2016-02-22 ENCOUNTER — Ambulatory Visit (INDEPENDENT_AMBULATORY_CARE_PROVIDER_SITE_OTHER): Payer: Medicaid Other | Admitting: Obstetrics & Gynecology

## 2016-02-22 ENCOUNTER — Other Ambulatory Visit (HOSPITAL_COMMUNITY)
Admission: RE | Admit: 2016-02-22 | Discharge: 2016-02-22 | Disposition: A | Discharge status: Home / Self Care | Payer: Medicaid Other | Source: Ambulatory Visit | Attending: Family Medicine | Admitting: Family Medicine

## 2016-02-22 ENCOUNTER — Encounter: Payer: Self-pay | Admitting: Obstetrics & Gynecology

## 2016-02-22 VITALS — BP 132/86 | HR 97 | Ht 62.0 in | Wt 251.0 lb

## 2016-02-22 DIAGNOSIS — N946 Dysmenorrhea, unspecified: Secondary | ICD-10-CM | POA: Insufficient documentation

## 2016-02-22 DIAGNOSIS — N939 Abnormal uterine and vaginal bleeding, unspecified: Secondary | ICD-10-CM | POA: Insufficient documentation

## 2016-02-22 DIAGNOSIS — Z113 Encounter for screening for infections with a predominantly sexual mode of transmission: Secondary | ICD-10-CM

## 2016-02-22 DIAGNOSIS — B9689 Other specified bacterial agents as the cause of diseases classified elsewhere: Secondary | ICD-10-CM | POA: Insufficient documentation

## 2016-02-22 NOTE — Progress Notes (Signed)
History:  41 y.o. No obstetric history on file. here today for AUB. Pt was seen on Oct for 1 month of bleeding.  Pt got some 'pills to stop the bleeding and it slowed it down'. Pt had chlamydia dx'd at that same visit. Since being tx'd her menses have gone back to normal.  Pt does not know if her partner was tx'd. She says that she is not with him but she has been sexually active with him since her last STI screen in 12/2015 and as recent as 2 weeks prev.  Pt also reports some vaginal itching that occurs off and on.   She reports that she has DM but, she does not check her [place] and reports that it is 'sometimes high and sometimes low'         + chlamydia in 10/2015. cx neg 12/15/2015  The following portions of the patient's history were reviewed and updated as appropriate: allergies, current medications, past family history, past medical history, past social history, past surgical history and problem list.  Review of Systems:  Pertinent items are noted in HPI.   Objective:  Physical Exam There were no vitals taken for this visit. Gen: NAD Abd: Soft, nontender and nondistended Pelvic: external genitalia- thick and beefy c/w chronic yeast. No abnormal discharge noted.; normal appearing vaginal mucosa and cervix. Small uterus, no other palpable masses, no uterine or adnexal tenderness  Labs and Imaging 11/22/2015 CLINICAL DATA:  Vaginal bleeding.  EXAM: TRANSABDOMINAL AND TRANSVAGINAL ULTRASOUND OF PELVIS  TECHNIQUE: Both transabdominal and transvaginal ultrasound examinations of the pelvis were performed. Transabdominal technique was performed for global imaging of the pelvis including uterus, ovaries, adnexal regions, and pelvic cul-de-sac. It was necessary to proceed with endovaginal exam following the transabdominal exam to visualize the uterus and ovaries.  COMPARISON:  No recent prior.  FINDINGS: Uterus  Measurements: 10.7 x 7.0 x 8.6 cm. 1.3 cm fibroid anterior  uterine body.  Endometrium  Thickness: 24 mm.  Endometrium is thickened and heterogeneous.  Right ovary  Measurements: 3.6 x 2.7 x 2.4 cm. 2.8 x 2.4 x 1.5 cm simple cyst scratched.  Left ovary  Measurements: 2.7 x 1.6 x 1.2 cm. 0.9 x 0.8 x 0.7 cm simple cyst.  Other findings  No abnormal free fluid.  IMPRESSION: Prominent thickening of the endometrium at 24 mm. If bleeding remains unresponsive to hormonal or medical therapy, focal lesion work-up with sonohysterogram should be considered. Endometrial biopsy should also be considered in pre-menopausal patients at high risk for endometrial carcinoma. (Ref: Radiological Reasoning: Algorithmic Workup of Abnormal Vaginal Bleeding with Endovaginal Sonography and Sonohysterography. AJR [id number])   Assessment & Plan:  AUB- resolved  STI screen.  Rec better control of DM Pt s/p wet mount to eval for yeast  F/u prn  Ronny Ruddell L. Harraway-Smith, M.D., Cherlynn June

## 2016-02-22 NOTE — Patient Instructions (Addendum)
Dysmenorrhea Menstrual cramps (dysmenorrhea) are caused by the muscles of the uterus tightening (contracting) during a menstrual period. For some women, this discomfort is merely bothersome. For others, dysmenorrhea can be severe enough to interfere with everyday activities for a few days each month. Primary dysmenorrhea is menstrual cramps that last a couple of days when you start having menstrual periods or soon after. This often begins after a teenager starts having her period. As a woman gets older or has a baby, the cramps will usually lessen or disappear. Secondary dysmenorrhea begins later in life, lasts longer, and the pain may be stronger than primary dysmenorrhea. The pain may start before the period and last a few days after the period. What are the causes? Dysmenorrhea is usually caused by an underlying problem, such as:  The tissue lining the uterus grows outside of the uterus in other areas of the body (endometriosis).  The endometrial tissue, which normally lines the uterus, is found in or grows into the muscular walls of the uterus (adenomyosis).  The pelvic blood vessels are engorged with blood just before the menstrual period (pelvic congestive syndrome).  Overgrowth of cells (polyps) in the lining of the uterus or cervix.  Falling down of the uterus (prolapse) because of loose or stretched ligaments.  Depression.  Bladder problems, infection, or inflammation.  Problems with the intestine, a tumor, or irritable bowel syndrome.  Cancer of the female organs or bladder.  A severely tipped uterus.  A very tight opening or closed cervix.  Noncancerous tumors of the uterus (fibroids).  Pelvic inflammatory disease (PID).  Pelvic scarring (adhesions) from a previous surgery.  Ovarian cyst.  An intrauterine device (IUD) used for birth control. What increases the risk? You may be at greater risk of dysmenorrhea if:  You are younger than age 30.  You started puberty  early.  You have irregular or heavy bleeding.  You have never given birth.  You have a family history of this problem.  You are a smoker. What are the signs or symptoms?  Cramping or throbbing pain in your lower abdomen.  Headaches.  Lower back pain.  Nausea or vomiting.  Diarrhea.  Sweating or dizziness.  Loose stools. How is this diagnosed? A diagnosis is based on your history, symptoms, physical exam, diagnostic tests, or procedures. Diagnostic tests or procedures may include:  Blood tests.  Ultrasonography.  An examination of the lining of the uterus (dilation and curettage, D&C).  An examination inside your abdomen or pelvis with a scope (laparoscopy).  X-rays.  CT scan.  MRI.  An examination inside the bladder with a scope (cystoscopy).  An examination inside the intestine or stomach with a scope (colonoscopy, gastroscopy). How is this treated? Treatment depends on the cause of the dysmenorrhea. Treatment may include:  Pain medicine prescribed by your health care provider.  Birth control pills or an IUD with progesterone hormone in it.  Hormone replacement therapy.  Nonsteroidal anti-inflammatory drugs (NSAIDs). These may help stop the production of prostaglandins.  Surgery to remove adhesions, endometriosis, ovarian cyst, or fibroids.  Removal of the uterus (hysterectomy).  Progesterone shots to stop the menstrual period.  Cutting the nerves on the sacrum that go to the female organs (presacral neurectomy).  Electric current to the sacral nerves (sacral nerve stimulation).  Antidepressant medicine.  Psychiatric therapy, counseling, or group therapy.  Exercise and physical therapy.  Meditation and yoga therapy.  Acupuncture. Follow these instructions at home:  Only take over-the-counter or prescription medicines as directed   by your health care provider.  Place a heating pad or hot water bottle on your lower back or abdomen. Do not  sleep with the heating pad.  Use aerobic exercises, walking, swimming, biking, and other exercises to help lessen the cramping.  Massage to the lower back or abdomen may help.  Stop smoking.  Avoid alcohol and caffeine. Contact a health care provider if:  Your pain does not get better with medicine.  You have pain with sexual intercourse.  Your pain increases and is not controlled with medicines.  You have abnormal vaginal bleeding with your period.  You develop nausea or vomiting with your period that is not controlled with medicine. Get help right away if: You pass out. This information is not intended to replace advice given to you by your health care provider. Make sure you discuss any questions you have with your health care provider. Document Released: 01/16/2005 Document Revised: 06/24/2015 Document Reviewed: 07/04/2012 Elsevier Interactive Patient Education  2017 Else[employer] Inc[employer]  Chlamydia, Female Chlamydia is an infection. It is spread from one person to another person during sexual contact. This infection can be in the cervix, urine tube (urethra), throat, or bottom (rectum). This infection needs treatment. HOME CARE   Take your medicines (antibiotics) as told. Finish them even if you start to feel better.  Only take medicine as told by your doctor.  Tell your sex partner(s) that you have chlamydia. They must also be treated.  Do not have sex until your doctor says it is okay.  Rest.  Eat healthy. Drink enough fluids to keep your pee (urine) clear or pale yellow.  Keep all doctor visits as told. GET HELP IF:  You have pain when you pee.  You have belly pain.  You have vaginal discharge.  You have pain during sex.  You have bleeding between periods and after sex.  You have a fever. GET HELP RIGHT AWAY IF:   You feel sick to your stomach (nauseous) or you throw up (vomit).  You sweat much more than normal (diaphoresis).  You have trouble  swallowing. This information is not intended to replace advice given to you by your health care provider. Make sure you discuss any questions you have with your health care provider. Document Released: 10/26/2007 Document Revised: 05/10/2015 Document Reviewed: 09/23/2012 Elsevier Interactive Patient Education  2017 [employer].

## 2016-02-23 LAB — CERVICOVAGINAL ANCILLARY ONLY
BACTERIAL VAGINITIS: POSITIVE — AB
Candida vaginitis: NEGATIVE
Chlamydia: NEGATIVE
NEISSERIA GONORRHEA: NEGATIVE
TRICH (WINDOWPATH): NEGATIVE

## 2016-02-24 ENCOUNTER — Telehealth: Payer: Self-pay | Admitting: *Deleted

## 2016-02-24 ENCOUNTER — Other Ambulatory Visit: Payer: Self-pay | Admitting: Obstetrics & Gynecology

## 2016-02-24 DIAGNOSIS — B9689 Other specified bacterial agents as the cause of diseases classified elsewhere: Secondary | ICD-10-CM

## 2016-02-24 DIAGNOSIS — N760 Acute vaginitis: Principal | ICD-10-CM

## 2016-02-24 MED ORDER — METRONIDAZOLE 500 MG PO TABS: 500.0000 mg | ORAL_TABLET | Freq: Two times a day (BID) | ORAL | 0 refills | Status: DC

## 2016-02-24 NOTE — Telephone Encounter (Signed)
Called patient and informed her of bv, flagyl sent to pharmacy. Patient voiced understanding, stated that this has been going on for a while. Her doctor in [facility] had been giving her antibiotics but she had a hard time clearing the infection. Advised patient to take the flagyl twice a day for 7 days as directed. If symptoms do not go away she needs to call us. Understanding voiced.

## 2016-02-28 ENCOUNTER — Emergency Department (HOSPITAL_COMMUNITY)
Admission: EM | Admit: 2016-02-28 | Discharge: 2016-02-29 | Disposition: A | Discharge status: Home / Self Care | Payer: Medicaid Other | Attending: Emergency Medicine | Admitting: Emergency Medicine

## 2016-02-28 ENCOUNTER — Encounter (HOSPITAL_COMMUNITY): Payer: Self-pay | Admitting: Emergency Medicine

## 2016-02-28 DIAGNOSIS — B354 Tinea corporis: Secondary | ICD-10-CM | POA: Diagnosis not present

## 2016-02-28 DIAGNOSIS — F17200 Nicotine dependence, unspecified, uncomplicated: Secondary | ICD-10-CM | POA: Diagnosis not present

## 2016-02-28 DIAGNOSIS — E119 Type 2 diabetes mellitus without complications: Secondary | ICD-10-CM | POA: Insufficient documentation

## 2016-02-28 DIAGNOSIS — I10 Essential (primary) hypertension: Secondary | ICD-10-CM | POA: Diagnosis not present

## 2016-02-28 DIAGNOSIS — Z7984 Long term (current) use of oral hypoglycemic drugs: Secondary | ICD-10-CM | POA: Diagnosis not present

## 2016-02-28 DIAGNOSIS — J45909 Unspecified asthma, uncomplicated: Secondary | ICD-10-CM | POA: Diagnosis not present

## 2016-02-28 DIAGNOSIS — L299 Pruritus, unspecified: Secondary | ICD-10-CM | POA: Diagnosis present

## 2016-02-28 MED ORDER — CLOTRIMAZOLE 1 % EX CREA: TOPICAL_CREAM | CUTANEOUS | 1 refills | Status: DC

## 2016-02-28 NOTE — Discharge Instructions (Signed)
Please follow with your primary care doctor in the next 2 days for a check-up. They must obtain records for further management.  ° °Do not hesitate to return to the Emergency Department for any new, worsening or concerning symptoms.  ° °

## 2016-02-28 NOTE — ED Provider Notes (Signed)
Crystal Brewer Provider Note   CSN: [id number] Arrival date & time: 02/28/16  2248     History   Chief Complaint No chief complaint on file.    HPI   Blood pressure 158/96, pulse 102, resp. rate 16, last menstrual period 02/11/2016, SpO2 100 %.  Crystal Brewer is a 41 y.o. female complaining of pruritic rash which she noticed on her back one month ago. It's now spreading to her torso. She denies fevers, chills, lesions on the palms and soles, sick contacts.  Past Medical History:  Diagnosis Date  . Anemia   . Asthma   . Bleeding disorder ([place])    "UNKNOWN PROBLEM" has not seen "blood doctor "yet  . Diabetes mellitus without complication ([place])   . Gallstones   . GERD (gastroesophageal reflux disease)   . Headache(784.0)   . Hx of degenerative disc disease   . Hypertension   . Obesity   . Thyroid disease     Patient Active Problem List   Diagnosis Date Noted  . Cholecystitis with cholelithiasis 09/26/2013  . Cholelithiasis with cholecystitis 09/02/2013    Past Surgical History:  Procedure Laterality Date  . CHOLECYSTECTOMY N/A 09/26/2013   Procedure: LAPAROSCOPIC CHOLECYSTECTOMY WITH INTRAOPERATIVE CHOLANGIOGRAM;  Surgeon: Earnstine Regal, MD;  Location: [facility];  Service: General;  Laterality: N/A;    OB History    Gravida Para Term Preterm AB Living   10 7 7  0 3 7   SAB TAB Ectopic Multiple Live Births   3 0 0 0 7       Home Medications    Prior to Admission medications   Medication Sig Start Date End Date Taking? Authorizing Provider  albuterol (PROVENTIL HFA;VENTOLIN HFA) 108 (90 BASE) MCG/ACT inhaler Inhale 2 puffs into the lungs every 4 (four) hours as needed for wheezing or shortness of breath.    Historical Provider, MD  canagliflozin (INVOKANA) 100 MG TABS tablet Take 100 mg by mouth daily.    Historical Provider, MD  cetirizine (ZYRTEC ALLERGY) 10 MG tablet Take 1 tablet (10 mg total) by mouth daily. 08/18/14   [name], PA-C  clotrimazole  (LOTRIMIN) 1 % cream Apply to affected area 2 times daily for at least 4 weeks ( for 1 week until after the lesions have healed) 02/28/16   Elmyra Ricks Danese Dorsainvil, PA-C  Fe Fum-Fe Poly-Vit C-Lactobac (FUSION) 65-65-25-30 MG CAPS Take 1 tablet by mouth daily.    Historical Provider, MD  lisinopril-hydrochlorothiazide (PRINZIDE,ZESTORETIC) 20-12.5 MG per tablet Take 1 tablet by mouth every morning.     Historical Provider, MD  metFORMIN (GLUCOPHAGE) 500 MG tablet Take 500 mg by mouth 2 (two) times daily with a meal.    Historical Provider, MD  metroNIDAZOLE (FLAGYL) 500 MG tablet Take 1 tablet (500 mg total) by mouth 2 (two) times daily. 02/24/16   [name], MD  naproxen (NAPROSYN) 500 MG tablet Take 1 tablet (500 mg total) by mouth 2 (two) times daily with a meal. 12/15/15   [name], MD  omeprazole (PRILOSEC) 20 MG capsule Take 40 mg by mouth daily.    Historical Provider, MD    Family History Family History  Problem Relation Age of Onset  . Cancer Mother     Pancreatic Cancer    Social History Social History  Substance Use Topics  . Smoking status: Current Every Day Smoker  . Smokeless tobacco: Never Used  . Alcohol use No     Comment: occ     Allergies  Patient has no known allergies.   Review of Systems Review of Systems  10 systems reviewed and found to be negative, except as noted in the HPI.   Physical Exam Updated Vital Signs BP 158/96 (BP Location: Right Arm)   Pulse 102   Resp 16   LMP 02/11/2016   SpO2 100%   Physical Exam  Constitutional: She is oriented to person, place, and time. She appears well-developed and well-nourished. No distress.  HENT:  Head: Normocephalic and atraumatic.  Mouth/Throat: Oropharynx is clear and moist.  Eyes: Conjunctivae and EOM are normal. Pupils are equal, round, and reactive to light.  Neck: Normal range of motion.  Cardiovascular: Normal rate, regular rhythm and intact distal pulses.   Pulmonary/Chest: Effort  normal and breath sounds normal.  Abdominal: Soft. There is no tenderness.  Musculoskeletal: Normal range of motion.  Neurological: She is alert and oriented to person, place, and time.  Skin: Rash noted. She is not diaphoretic.  Annular slightly raised scaled lesions to the back and one on the abdomen positive central clearing, no surrounding discharge or tenderness to palpation. Lesions are blanchable and they spare the palms soles and mucous membranes.  Psychiatric: She has a normal mood and affect.  Nursing note and vitals reviewed.    ED Treatments / Results  Labs (all labs ordered are listed, but only abnormal results are displayed) Labs Reviewed - No data to display  EKG  EKG Interpretation None       Radiology No results found.  Procedures Procedures (including critical care time)  Medications Ordered in ED Medications - No data to display   Initial Impression / Assessment and Plan / ED Course  I have reviewed the triage vital signs and the nursing notes.  Pertinent labs & imaging results that were available during my care of the patient were reviewed by me and considered in my medical decision making (see chart for details).     Vitals:   02/28/16 2255  BP: 158/96  Pulse: 102  Resp: 16  SpO2: 100%    Medications - No data to display  [name] is 41 y.o. female presenting with Pruritic rash consistent with tinea corporis.*  Evaluation does not show pathology that would require ongoing emergent intervention or inpatient treatment. Pt is hemodynamically stable and mentating appropriately. Discussed findings and plan with patient/guardian, who agrees with care plan. All questions answered. Return precautions discussed and outpatient follow up given.      Final Clinical Impressions(s) / ED Diagnoses   Final diagnoses:  Tinea corporis    New Prescriptions New Prescriptions   CLOTRIMAZOLE (LOTRIMIN) 1 % CREAM    Apply to affected area 2 times  daily for at least 4 weeks ( for 1 week until after the lesions have healed)     Monico Blitz, PA-C 02/28/16 [place], MD 03/01/16 [phone]

## 2016-02-28 NOTE — ED Triage Notes (Signed)
Pt c/o generalized rash to her back that she noticed a few months that is continuing to spread

## 2016-03-23 ENCOUNTER — Emergency Department (HOSPITAL_COMMUNITY)
Admission: EM | Admit: 2016-03-23 | Discharge: 2016-03-23 | Disposition: A | Discharge status: Home / Self Care | Payer: Medicaid Other | Attending: Emergency Medicine | Admitting: Emergency Medicine

## 2016-03-23 ENCOUNTER — Encounter (HOSPITAL_COMMUNITY): Payer: Self-pay | Admitting: Emergency Medicine

## 2016-03-23 DIAGNOSIS — R21 Rash and other nonspecific skin eruption: Secondary | ICD-10-CM | POA: Diagnosis present

## 2016-03-23 DIAGNOSIS — E119 Type 2 diabetes mellitus without complications: Secondary | ICD-10-CM | POA: Insufficient documentation

## 2016-03-23 DIAGNOSIS — I10 Essential (primary) hypertension: Secondary | ICD-10-CM | POA: Diagnosis not present

## 2016-03-23 DIAGNOSIS — F17200 Nicotine dependence, unspecified, uncomplicated: Secondary | ICD-10-CM | POA: Diagnosis not present

## 2016-03-23 DIAGNOSIS — Z7984 Long term (current) use of oral hypoglycemic drugs: Secondary | ICD-10-CM | POA: Diagnosis not present

## 2016-03-23 DIAGNOSIS — J45909 Unspecified asthma, uncomplicated: Secondary | ICD-10-CM | POA: Insufficient documentation

## 2016-03-23 DIAGNOSIS — L42 Pityriasis rosea: Secondary | ICD-10-CM | POA: Diagnosis not present

## 2016-03-23 NOTE — ED Notes (Signed)
Ward, MD at bedside. 

## 2016-03-23 NOTE — ED Triage Notes (Addendum)
Pt presents with persistent rash that "has got worse since last time I was here"; pt states the rash also itches, denies drainage, denies fever; pt also states that her son has similar rash

## 2016-03-23 NOTE — ED Provider Notes (Signed)
By signing my name below, I, [name], attest that this documentation has been prepared under the direction and in the presence of [place], DO  Electronically Signed: [name], ED Scribe. 03/23/16. 3:57 AM.  TIME SEEN: 3:50 AM  CHIEF COMPLAINT:  Chief Complaint  Patient presents with  . Rash    HPI:   Crystal Brewer is a 41 y.o. female who presents to the Emergency Department complaining of a worsening rash to her chest, neck and back onset several months. She has been using a ringworm cream with no relief. Pt denies any other associated symptoms. No other modifying factors noted. No other complaints noted. No rash involving her palms, soles or mucous membranes. No fever. Rash is not pruritic.   ROS: See HPI Constitutional: no fever  Eyes: no drainage  ENT: no runny nose   Cardiovascular:  no chest pain  Resp: no SOB  GI: no vomiting GU: no dysuria Integumentary: +rash  Allergy: no hives  Musculoskeletal: no leg swelling  Neurological: no slurred speech ROS otherwise negative  PAST MEDICAL HISTORY/PAST SURGICAL HISTORY:  Past Medical History:  Diagnosis Date  . Anemia   . Asthma   . Bleeding disorder ([name])    "UNKNOWN PROBLEM" has not seen "blood doctor "yet  . Diabetes mellitus without complication ([name])   . Gallstones   . GERD (gastroesophageal reflux disease)   . Headache(784.0)   . Hx of degenerative disc disease   . Hypertension   . Obesity   . Thyroid disease     MEDICATIONS:  Prior to Admission medications   Medication Sig Start Date End Date Taking? Authorizing Provider  albuterol (PROVENTIL HFA;VENTOLIN HFA) 108 (90 BASE) MCG/ACT inhaler Inhale 2 puffs into the lungs every 4 (four) hours as needed for wheezing or shortness of breath.    Historical Provider, MD  canagliflozin (INVOKANA) 100 MG TABS tablet Take 100 mg by mouth daily.    Historical Provider, MD  cetirizine (ZYRTEC ALLERGY) 10 MG tablet Take 1 tablet (10 mg total) by mouth daily.  08/18/14   [name], PA-C  clotrimazole (LOTRIMIN) 1 % cream Apply to affected area 2 times daily for at least 4 weeks ( for 1 week until after the lesions have healed) 02/28/16   [name], PA-C  Fe Fum-Fe Poly-Vit C-Lactobac (FUSION) 65-65-25-30 MG CAPS Take 1 tablet by mouth daily.    Historical Provider, MD  lisinopril-hydrochlorothiazide (PRINZIDE,ZESTORETIC) 20-12.5 MG per tablet Take 1 tablet by mouth every morning.     Historical Provider, MD  metFORMIN (GLUCOPHAGE) 500 MG tablet Take 500 mg by mouth 2 (two) times daily with a meal.    Historical Provider, MD  metroNIDAZOLE (FLAGYL) 500 MG tablet Take 1 tablet (500 mg total) by mouth 2 (two) times daily. 02/24/16   [name], MD  naproxen (NAPROSYN) 500 MG tablet Take 1 tablet (500 mg total) by mouth 2 (two) times daily with a meal. 12/15/15   [name], MD  omeprazole (PRILOSEC) 20 MG capsule Take 40 mg by mouth daily.    Historical Provider, MD    ALLERGIES:  No Known Allergies  SOCIAL HISTORY:  Social History  Substance Use Topics  . Smoking status: Current Every Day Smoker  . Smokeless tobacco: Never Used  . Alcohol use No     Comment: occ    FAMILY HISTORY: Family History  Problem Relation Age of Onset  . Cancer Mother     Pancreatic Cancer    EXAM: BP 143/93 (  BP Location: Right Arm)   Pulse 104   Temp 97.6 F (36.4 C) (Oral)   Resp 18   SpO2 98%  CONSTITUTIONAL: Alert and oriented and responds appropriately to questions. Well-appearing; well-nourished HEAD: Normocephalic EYES: Conjunctivae clear, PERRL, EOMI ENT: normal nose; no rhinorrhea; moist mucous membranes NECK: Supple, no meningismus, no nuchal rigidity, no LAD  CARD: RRR; S1 and S2 appreciated; no murmurs, no clicks, no rubs, no gallops RESP: Normal chest excursion without splinting or tachypnea; breath sounds clear and equal bilaterally; no wheezes, no rhonchi, no rales, no hypoxia or respiratory distress, speaking full  sentences ABD/GI: Normal bowel sounds; non-distended; soft, non-tender, no rebound, no guarding, no peritoneal signs, no hepatosplenomegaly BACK:  The back appears normal and is non-tender to palpation, there is no CVA tenderness EXT: Normal ROM in all joints; non-tender to palpation; no edema; normal capillary refill; no cyanosis, no calf tenderness or swelling    SKIN: Normal color for age and race; warm; small eczematous-like lesions spread out like Christmas tree distribution to the torso. No rash on the extremities. No rash involving the palms, soles or mucus membranes. No [place] rash. No petechia or purpura. No blisters or desquamation. No erythema, warmth. No fluctuance or induration. No hives. NEURO: Moves all extremities equally PSYCH: The patient's mood and manner are appropriate. Grooming and personal hygiene are appropriate.  MEDICAL DECISION MAKING: Patient here with what looks like pityriasis rosea. Was diagnosed with possible tinea when the rash started in January and has been using antifungal cream without any relief. This looks very classic for pityriasis rosea without signs of superimposed infection. No sign of any life-threatening rash today. Patient is asking for an antibiotic which I have explained to her would not be helpful and could be harmful to her. No sign of any bacterial cause of this rash, fungal cause currently or allergic/inflammatory cause. I feel she is safe for discharge home. She has a PCP for follow-up.    At this time, I do not feel there is any life-threatening condition present. I have reviewed and discussed all results (EKG, imaging, lab, urine as appropriate) and exam findings with patient/family. I have reviewed nursing notes and appropriate previous records.  I feel the patient is safe to be discharged home without further emergent workup and can continue workup as an outpatient as needed. Discussed usual and customary return precautions. Patient/family  verbalize understanding and are comfortable with this plan.  Outpatient follow-up has been provided. All questions have been answered.   I personally performed the services described in this documentation, which was scribed in my presence. The recorded information has been reviewed and is accurate.     [place], DO 03/23/16 0400

## 2016-08-11 ENCOUNTER — Encounter (HOSPITAL_COMMUNITY): Payer: Self-pay | Admitting: Emergency Medicine

## 2016-08-11 ENCOUNTER — Emergency Department (HOSPITAL_COMMUNITY): Payer: Medicaid Other

## 2016-08-11 ENCOUNTER — Emergency Department (HOSPITAL_COMMUNITY)
Admission: EM | Admit: 2016-08-11 | Discharge: 2016-08-11 | Disposition: A | Discharge status: Home / Self Care | Payer: Medicaid Other | Attending: Emergency Medicine | Admitting: Emergency Medicine

## 2016-08-11 DIAGNOSIS — W1830XA Fall on same level, unspecified, initial encounter: Secondary | ICD-10-CM | POA: Insufficient documentation

## 2016-08-11 DIAGNOSIS — F17200 Nicotine dependence, unspecified, uncomplicated: Secondary | ICD-10-CM | POA: Insufficient documentation

## 2016-08-11 DIAGNOSIS — Z79899 Other long term (current) drug therapy: Secondary | ICD-10-CM | POA: Diagnosis not present

## 2016-08-11 DIAGNOSIS — I10 Essential (primary) hypertension: Secondary | ICD-10-CM | POA: Diagnosis not present

## 2016-08-11 DIAGNOSIS — J45909 Unspecified asthma, uncomplicated: Secondary | ICD-10-CM | POA: Diagnosis not present

## 2016-08-11 DIAGNOSIS — Y929 Unspecified place or not applicable: Secondary | ICD-10-CM | POA: Insufficient documentation

## 2016-08-11 DIAGNOSIS — Y999 Unspecified external cause status: Secondary | ICD-10-CM | POA: Diagnosis not present

## 2016-08-11 DIAGNOSIS — M25562 Pain in left knee: Secondary | ICD-10-CM | POA: Insufficient documentation

## 2016-08-11 DIAGNOSIS — Z7984 Long term (current) use of oral hypoglycemic drugs: Secondary | ICD-10-CM | POA: Insufficient documentation

## 2016-08-11 DIAGNOSIS — Y939 Activity, unspecified: Secondary | ICD-10-CM | POA: Insufficient documentation

## 2016-08-11 DIAGNOSIS — E119 Type 2 diabetes mellitus without complications: Secondary | ICD-10-CM | POA: Insufficient documentation

## 2016-08-11 MED ORDER — NAPROXEN 500 MG PO TABS: 500.0000 mg | ORAL_TABLET | Freq: Two times a day (BID) | ORAL | 0 refills | Status: DC

## 2016-08-11 MED ORDER — NAPROXEN 250 MG PO TABS
500.0000 mg | ORAL_TABLET | Freq: Once | ORAL | Status: AC
  Administered 2016-08-11: 500 mg via ORAL
  Filled 2016-08-11: qty 2

## 2016-08-11 NOTE — ED Notes (Signed)
Pt made acuity 3 based on VS

## 2016-08-11 NOTE — ED Triage Notes (Signed)
Pt reports tripping over something 3 says ago. Pt has had L leg pain ever since. Ambulatory.

## 2016-08-11 NOTE — ED Provider Notes (Signed)
[facility] DEPT Provider Note   CSN: [id number] Arrival date & time: 08/11/16  0152     History   Chief Complaint Chief Complaint  Patient presents with  . Leg Pain    HPI Crystal Brewer is a 41 y.o. female with a hx of HTN, diabetes, gallstones presents to the Emergency Department complaining of acute, persistent left knee pain onset 4 days ago.  Pt reports she Tripped and fell twisting her left knee. Patient reports she's been able to ambulate but with pain. She states range of motion to the left knee is painful. No treatments prior to arrival. Nothing seems to make her symptoms better. Walking and movement make them worse. Patient denies IV drug use, fevers, chills, swelling of her knee.  The history is provided by the patient and medical records. No language interpreter was used.    Past Medical History:  Diagnosis Date  . Anemia   . Asthma   . Bleeding disorder ([place])    "UNKNOWN PROBLEM" has not seen "blood doctor "yet  . Diabetes mellitus without complication ([place])   . Gallstones   . GERD (gastroesophageal reflux disease)   . Headache(784.0)   . Hx of degenerative disc disease   . Hypertension   . Obesity   . Thyroid disease     Patient Active Problem List   Diagnosis Date Noted  . Cholecystitis with cholelithiasis 09/26/2013  . Cholelithiasis with cholecystitis 09/02/2013    Past Surgical History:  Procedure Laterality Date  . CHOLECYSTECTOMY N/A 09/26/2013   Procedure: LAPAROSCOPIC CHOLECYSTECTOMY WITH INTRAOPERATIVE CHOLANGIOGRAM;  Surgeon: Earnstine Regal, MD;  Location: [facility];  Service: General;  Laterality: N/A;    OB History    Gravida Para Term Preterm AB Living   10 7 7  0 3 7   SAB TAB Ectopic Multiple Live Births   3 0 0 0 7       Home Medications    Prior to Admission medications   Medication Sig Start Date End Date Taking? Authorizing Provider  albuterol (PROVENTIL HFA;VENTOLIN HFA) 108 (90 BASE) MCG/ACT inhaler Inhale 2 puffs into the  lungs every 4 (four) hours as needed for wheezing or shortness of breath.   Yes [provider]  canagliflozin (INVOKANA) 100 MG TABS tablet Take 100 mg by mouth daily.   Yes [provider]  cetirizine (ZYRTEC ALLERGY) 10 MG tablet Take 1 tablet (10 mg total) by mouth daily. 08/18/14  Yes [name], PA-C  lisinopril-hydrochlorothiazide (PRINZIDE,ZESTORETIC) 20-12.5 MG per tablet Take 1 tablet by mouth every morning.    Yes [provider]  metFORMIN (GLUCOPHAGE) 500 MG tablet Take 500 mg by mouth 2 (two) times daily with a meal.   Yes [provider]  omeprazole (PRILOSEC) 20 MG capsule Take 40 mg by mouth daily.   Yes [provider]  naproxen (NAPROSYN) 500 MG tablet Take 1 tablet (500 mg total) by mouth 2 (two) times daily with a meal. 08/11/16   Britiney Blahnik, Jarrett Soho, PA-C    Family History Family History  Problem Relation Age of Onset  . Cancer Mother        Pancreatic Cancer    Social History Social History  Substance Use Topics  . Smoking status: Current Every Day Smoker  . Smokeless tobacco: Never Used  . Alcohol use No     Comment: occ     Allergies   Patient has no known allergies.   Review of Systems Review of Systems  Constitutional: Negative for  chills and fever.  Gastrointestinal: Negative for nausea and vomiting.  Musculoskeletal: Positive for arthralgias and joint swelling. Negative for back pain, neck pain and neck stiffness.  Skin: Negative for wound.  Neurological: Negative for numbness.  Hematological: Does not bruise/bleed easily.  Psychiatric/Behavioral: The patient is not nervous/anxious.   All other systems reviewed and are negative.    Physical Exam Updated Vital Signs BP 133/88   Pulse 91   Temp 98.3 F (36.8 C) (Oral)   Resp 18   Ht 5\' 2"  (1.575 m)   Wt 113.4 kg (250 lb)   LMP 07/09/2016 (Approximate)   SpO2 100%   BMI 45.73 kg/m   Physical Exam  Constitutional: She appears  well-developed and well-nourished. No distress.  HENT:  Head: Normocephalic and atraumatic.  Eyes: Conjunctivae are normal.  Neck: Normal range of motion.  Cardiovascular: Normal rate, regular rhythm and intact distal pulses.   Capillary refill < 3 sec  Pulmonary/Chest: Effort normal and breath sounds normal.  Musculoskeletal: She exhibits tenderness. She exhibits no edema.  Tenderness to palpation throughout the left knee. Full range of motion of the left hip, knee and ankle. No abnormal patellar movement. Patellar tendon is intact. Sensation to normal touch intact throughout the left lower extremity. Strength is 5/5 including dorsiflexion and plantar flexion. Patient with antalgic gait but is able to weight-bear  Neurological: She is alert. Coordination normal.  Skin: Skin is warm and dry. She is not diaphoretic.  No tenting of the skin  Psychiatric: She has a normal mood and affect.  Nursing note and vitals reviewed.    ED Treatments / Results   Radiology Dg Knee Complete 4 Views Left  Result Date: 08/11/2016 CLINICAL DATA:  41 year old female with fall and left knee pain. EXAM: LEFT KNEE - COMPLETE 4+ VIEW COMPARISON:  None. FINDINGS: There is no acute fracture or dislocation. The bones are well mineralized. Minimal degenerative changes. No significant joint effusion. Mildly prominent vasculature in the superficial soft tissues, likely vicarious veins. IMPRESSION: No acute fracture or dislocation. Electronically Signed   By: Anner Crete M.D.   On: 08/11/2016 03:44    Procedures Procedures (including critical care time)  Medications Ordered in ED Medications  naproxen (NAPROSYN) tablet 500 mg (500 mg Oral Given 08/11/16 0248)     Initial Impression / Assessment and Plan / ED Course  I have reviewed the triage vital signs and the nursing notes.  Pertinent labs & imaging results that were available during my care of the patient were reviewed by me and considered in my  medical decision making (see chart for details).     Patient X-Ray negative for obvious fracture or dislocation. Pain managed in ED. Pt advised to follow up with orthopedics if symptoms persist for possibility of missed fracture diagnosis. Patient given brace while in ED, conservative therapy recommended and discussed. Patient will be dc home & is agreeable with above plan.   Final Clinical Impressions(s) / ED Diagnoses   Final diagnoses:  Acute pain of left knee    New Prescriptions New Prescriptions   NAPROXEN (NAPROSYN) 500 MG TABLET    Take 1 tablet (500 mg total) by mouth 2 (two) times daily with a meal.     Abigail Butts, PA-C 08/11/16 [place], Barbette Hair, MD 08/11/16 [phone]

## 2016-08-11 NOTE — ED Notes (Signed)
Patient left at this time with all belongings. 

## 2016-08-11 NOTE — Discharge Instructions (Signed)

## 2016-08-27 ENCOUNTER — Emergency Department (HOSPITAL_COMMUNITY)
Admission: EM | Admit: 2016-08-27 | Discharge: 2016-08-28 | Disposition: A | Discharge status: Home / Self Care | Payer: Medicaid Other | Attending: Emergency Medicine | Admitting: Emergency Medicine

## 2016-08-27 ENCOUNTER — Encounter (HOSPITAL_COMMUNITY): Payer: Self-pay

## 2016-08-27 DIAGNOSIS — Z79899 Other long term (current) drug therapy: Secondary | ICD-10-CM | POA: Insufficient documentation

## 2016-08-27 DIAGNOSIS — J45909 Unspecified asthma, uncomplicated: Secondary | ICD-10-CM | POA: Insufficient documentation

## 2016-08-27 DIAGNOSIS — E119 Type 2 diabetes mellitus without complications: Secondary | ICD-10-CM | POA: Insufficient documentation

## 2016-08-27 DIAGNOSIS — Z7984 Long term (current) use of oral hypoglycemic drugs: Secondary | ICD-10-CM | POA: Insufficient documentation

## 2016-08-27 DIAGNOSIS — I10 Essential (primary) hypertension: Secondary | ICD-10-CM | POA: Diagnosis not present

## 2016-08-27 DIAGNOSIS — F17200 Nicotine dependence, unspecified, uncomplicated: Secondary | ICD-10-CM | POA: Insufficient documentation

## 2016-08-27 DIAGNOSIS — M79662 Pain in left lower leg: Secondary | ICD-10-CM | POA: Insufficient documentation

## 2016-08-27 DIAGNOSIS — M79605 Pain in left leg: Secondary | ICD-10-CM | POA: Diagnosis present

## 2016-08-27 DIAGNOSIS — M79661 Pain in right lower leg: Secondary | ICD-10-CM

## 2016-08-27 DIAGNOSIS — M7989 Other specified soft tissue disorders: Secondary | ICD-10-CM | POA: Diagnosis not present

## 2016-08-27 MED ORDER — ENOXAPARIN SODIUM 120 MG/0.8ML ~~LOC~~ SOLN
1.0000 mg/kg | Freq: Once | SUBCUTANEOUS | Status: AC
  Administered 2016-08-27: 110 mg via SUBCUTANEOUS
  Filled 2016-08-27: qty 0.74

## 2016-08-27 NOTE — ED Triage Notes (Signed)
Pt c/o L knee pain x3 weeks. Recently seen at [facility] for same. She also endorses bilateral calf pain starting 2 days ago. A&Ox4. Ambulatory. Denies recent long trips, or redness or swelling to calves.

## 2016-08-27 NOTE — ED Provider Notes (Signed)
[facility] DEPT Provider Note   CSN: [id number] Arrival date & time: 08/27/16  2234     History   Chief Complaint Chief Complaint  Patient presents with  . Calf Pain    bilateral  . Knee Pain    L    HPI Crystal Brewer is a 41 y.o. female with 20 days of left knee pain Who has been previously seen and evaluated for this, presents today with continued left knee pain, and bilateral calf pain left worse than right starting 4 days ago. She is not wearing the knee brace she was previously given when she presents today for evaluation.  She reports that she does smoke cigarettes, denies any recent trips, or travels. She does not take any estrogen, has not had surgery recently and has no personal history of blood clots. She denies any shortness of breath or difficulty breathing. HPI  Past Medical History:  Diagnosis Date  . Anemia   . Asthma   . Bleeding disorder ([place])    "UNKNOWN PROBLEM" has not seen "blood doctor "yet  . Diabetes mellitus without complication ([place])   . Gallstones   . GERD (gastroesophageal reflux disease)   . Headache(784.0)   . Hx of degenerative disc disease   . Hypertension   . Obesity   . Thyroid disease     Patient Active Problem List   Diagnosis Date Noted  . Cholecystitis with cholelithiasis 09/26/2013  . Cholelithiasis with cholecystitis 09/02/2013    Past Surgical History:  Procedure Laterality Date  . CHOLECYSTECTOMY N/A 09/26/2013   Procedure: LAPAROSCOPIC CHOLECYSTECTOMY WITH INTRAOPERATIVE CHOLANGIOGRAM;  Surgeon: Earnstine Regal, MD;  Location: [facility];  Service: General;  Laterality: N/A;    OB History    Gravida Para Term Preterm AB Living   10 7 7  0 3 7   SAB TAB Ectopic Multiple Live Births   3 0 0 0 7       Home Medications    Prior to Admission medications   Medication Sig Start Date End Date Taking? Authorizing Provider  albuterol (PROVENTIL HFA;VENTOLIN HFA) 108 (90 BASE) MCG/ACT inhaler Inhale 2 puffs into the lungs every  4 (four) hours as needed for wheezing or shortness of breath.    [provider]  canagliflozin (INVOKANA) 100 MG TABS tablet Take 100 mg by mouth daily.    [provider]  cetirizine (ZYRTEC ALLERGY) 10 MG tablet Take 1 tablet (10 mg total) by mouth daily. 08/18/14   [name], PA-C  ibuprofen (ADVIL,MOTRIN) 600 MG tablet Take 1 tablet (600 mg total) by mouth every 8 (eight) hours as needed for mild pain or moderate pain. Please take with food 08/28/16   Lorin Glass, PA-C  lisinopril-hydrochlorothiazide (PRINZIDE,ZESTORETIC) 20-12.5 MG per tablet Take 1 tablet by mouth every morning.     [provider]  metFORMIN (GLUCOPHAGE) 500 MG tablet Take 500 mg by mouth 2 (two) times daily with a meal.    [provider]  naproxen (NAPROSYN) 500 MG tablet Take 1 tablet (500 mg total) by mouth 2 (two) times daily with a meal. 08/11/16   [place], Jarrett Soho, PA-C  omeprazole (PRILOSEC) 20 MG capsule Take 40 mg by mouth daily.    [provider]    Family History Family History  Problem Relation Age of Onset  . Cancer Mother        Pancreatic Cancer    Social History Social History  Substance Use Topics  . Smoking status: Current Every Day  Smoker  . Smokeless tobacco: Never Used  . Alcohol use No     Comment: occ     Allergies   Patient has no known allergies.   Review of Systems Review of Systems  Constitutional: Negative for chills and fever.  Respiratory: Negative for cough and shortness of breath.   Cardiovascular: Positive for leg swelling (Bilateral with pain to calf).  Musculoskeletal:       Bilateral calf pain  Neurological: Negative for weakness.     Physical Exam Updated Vital Signs BP 132/89   Pulse 100   Temp 97.7 F (36.5 C) (Oral)   Resp 16   Ht 5\' 2"  (1.575 m)   Wt 111.3 kg (245 lb 6.4 oz)   SpO2 99%   BMI 44.88 kg/m   Physical Exam  Constitutional: She appears well-developed and well-nourished.    HENT:  Head: Normocephalic and atraumatic.  Eyes: Conjunctivae are normal. No scleral icterus.  Neck: Normal range of motion.  Cardiovascular: Normal rate, regular rhythm and intact distal pulses.   Pulmonary/Chest: Effort normal. No respiratory distress.  Abdominal: Soft. She exhibits no distension. There is no tenderness.  Musculoskeletal:  Swelling to lower legs bilaterally, no pitting edema. Patient with tenderness to palpation over bilateral posterior lower legs, worse around the knee and worse on the left side.  Lymphadenopathy:    She has no cervical adenopathy.  Neurological: She is alert. No sensory deficit. She exhibits normal muscle tone.  Skin: Skin is warm and dry. No rash noted. She is not diaphoretic.  Psychiatric: She has a normal mood and affect. Her behavior is normal.  Nursing note and vitals reviewed.    ED Treatments / Results  Labs (all labs ordered are listed, but only abnormal results are displayed) Labs Reviewed - No data to display  EKG  EKG Interpretation None       Radiology No results found.  Procedures Procedures (including critical care time)  Medications Ordered in ED Medications  enoxaparin (LOVENOX) injection 110 mg (110 mg Subcutaneous Given 08/27/16 2359)     Initial Impression / Assessment and Plan / ED Course  I have reviewed the triage vital signs and the nursing notes.  Pertinent labs & imaging results that were available during my care of the patient were reviewed by me and considered in my medical decision making (see chart for details).    [facility] presents with bilateral calf pain and reported leg swelling. Based on her recent left knee injury and decreased activity levels concern for DVT. She was given Lovenox injection subcutaneous here and scheduled for vascular study tomorrow at [facility]. She was given strict instructions, both verbally and written, about where she needs to go and went for this. She has an  appointment scheduled with her PCP tomorrow afternoon and I suggested that she keep this appointment. At this time I have low suspicion for PE as she is not having any increased shortness of breath, hypoxia, chest pain, or fatigue.    Patient hemodynamically stable at this time appears safe for discharge, given strict return precautions, instructions on follow-up. She was given the option to ask questions, all of which were answered to the best of my abilities.   Final Clinical Impressions(s) / ED Diagnoses   Final diagnoses:  Leg swelling  Bilateral calf pain    New Prescriptions Discharge Medication List as of 08/28/2016 12:03 AM    START taking these medications   Details  ibuprofen (ADVIL,MOTRIN) 600 MG tablet  Take 1 tablet (600 mg total) by mouth every 8 (eight) hours as needed for mild pain or moderate pain. Please take with food, Starting Mon 08/28/2016, Print         Lorin Glass, [place] 08/28/16 Beryle Quant    Rolland Porter, MD 08/28/16 [phone]

## 2016-08-28 ENCOUNTER — Ambulatory Visit (HOSPITAL_COMMUNITY): Admission: RE | Admit: 2016-08-28 | Payer: Medicaid Other | Source: Ambulatory Visit

## 2016-08-28 ENCOUNTER — Ambulatory Visit (HOSPITAL_COMMUNITY)
Admission: RE | Admit: 2016-08-28 | Discharge: 2016-08-28 | Disposition: A | Discharge status: Home / Self Care | Payer: Medicaid Other | Source: Ambulatory Visit | Attending: Physician Assistant | Admitting: Physician Assistant

## 2016-08-28 DIAGNOSIS — M79604 Pain in right leg: Secondary | ICD-10-CM | POA: Insufficient documentation

## 2016-08-28 DIAGNOSIS — M79609 Pain in unspecified limb: Secondary | ICD-10-CM | POA: Diagnosis not present

## 2016-08-28 DIAGNOSIS — M7989 Other specified soft tissue disorders: Secondary | ICD-10-CM

## 2016-08-28 DIAGNOSIS — M7122 Synovial cyst of popliteal space [Baker], left knee: Secondary | ICD-10-CM | POA: Insufficient documentation

## 2016-08-28 DIAGNOSIS — M79605 Pain in left leg: Secondary | ICD-10-CM | POA: Diagnosis present

## 2016-08-28 MED ORDER — IBUPROFEN 600 MG PO TABS: 600.0000 mg | ORAL_TABLET | Freq: Three times a day (TID) | ORAL | 0 refills | Status: DC | PRN

## 2016-08-28 NOTE — Discharge Instructions (Signed)
IMPORTANT PATIENT INSTRUCTIONS:  You have been scheduled for an Outpatient Vascular Study at [facility].    If tomorrow is a Saturday, Sunday or holiday, please go to the [facility] Emergency Department Registration Desk at 8 am tomorrow morning and tell them you are there for a vascular study.  If tomorrow is a weekday (Monday-Friday), please go to [facility] Admitting Department at 8 am and tell them you are there for a vascular study.  Please keep your appointment with your primary care doctor tomorrow.   Please do not take ibuprofen for the next 12 hours.

## 2016-08-28 NOTE — Progress Notes (Signed)
*  PRELIMINARY RESULTS* Vascular Ultrasound Lower extremity venous duplex has been completed.  Preliminary findings: No evidence of DVT bilaterally. Left baker's cyst noted.    Landry Mellow, RDMS, RVT  08/28/2016, 4:13 PM

## 2016-09-13 ENCOUNTER — Encounter (INDEPENDENT_AMBULATORY_CARE_PROVIDER_SITE_OTHER): Payer: Self-pay | Admitting: Physician Assistant

## 2016-09-13 ENCOUNTER — Ambulatory Visit (INDEPENDENT_AMBULATORY_CARE_PROVIDER_SITE_OTHER): Payer: Medicaid Other | Admitting: Physician Assistant

## 2016-09-13 VITALS — Ht 62.0 in | Wt 251.0 lb

## 2016-09-13 DIAGNOSIS — M25562 Pain in left knee: Secondary | ICD-10-CM

## 2016-09-13 MED ORDER — METHYLPREDNISOLONE ACETATE 40 MG/ML IJ SUSP
40.0000 mg | INTRAMUSCULAR | Status: AC | PRN
  Administered 2016-09-13: 40 mg via INTRA_ARTICULAR

## 2016-09-13 MED ORDER — LIDOCAINE HCL 1 % IJ SOLN
3.0000 mL | INTRAMUSCULAR | Status: AC | PRN
  Administered 2016-09-13: 3 mL

## 2016-09-13 NOTE — Progress Notes (Signed)
Office Visit Note   Patient: Crystal Brewer           Date of Birth: [birth year]           MRN: [id number] Visit Date: 09/13/2016              Requested by: Crystal Ebbs, MD [address] [place], [place] [address] PCP: Crystal Ebbs, MD   Assessment & Plan: Visit Diagnoses: No diagnosis found.  Plan: Quad strengthening exercises discussed and reviewed with patient. Discussed knee friendly exercises with her. We'll have her continue her diclofenac no other NSAIDs while on this. Did discuss with her that she needs to check glucose levels as the steroid injection may cause these to increase. See her back in 2 weeks' check progress lack if her mechanical symptoms and pain continues may consider MRI to rule out internal derangement.  Follow-Up Instructions: Return in about 2 weeks (around 09/27/2016).   Orders:  Orders Placed This Encounter  Procedures  . Large Joint Injection/Arthrocentesis   No orders of the defined types were placed in this encounter.     Procedures: Large Joint Inj Date/Time: 09/13/2016 3:52 PM Performed by: Crystal Brewer Authorized by: Crystal Brewer   Consent Given by:  Patient Indications:  Pain Location:  Knee Site:  L knee Needle Size:  22 G Approach:  Anterolateral Ultrasound Guidance: No   Fluoroscopic Guidance: No   Medications:  3 mL lidocaine 1 %; 40 mg methylPREDNISolone acetate 40 MG/ML Aspiration Attempted: No   Patient tolerance:  Patient tolerated the procedure well with no immediate complications      Clinical Data: No additional findings.   Subjective: Chief Complaint  Patient presents with  . Left Knee - Pain  . Right Leg - Pain  . Left Leg - Pain    HPI Crystal Brewer is a 41 year old female comes in today with left knee pain that's been ongoing for the past 2-3 months. She states about a month ago she had incident where she lost her balance and since then she's had increased pain in the knee. She feels the knee gives  way and catches at times. She also has some painful popping in the knee. No frank locking of the knee. She states Percocet is helped with her knee in the past with the diclofenac which she's been taking the past week really has not helped. She has difficulty going up and down stairs. She states her pain is constant all the time qualifies it is an achy pain. She is diabetic she is unsure of her last hemoglobin A1c is but she says that her glucose levels usually stay below 120. Radiographs AP view and lateral view of the left knee dated 08/11/2016 showed no acute fracture. Medial and  lateral joint lines appear to be well maintained. Knee is well located. Slight lateralization of the patella. Doppler bilateral lower extremities dated 08/28/2016 showed no evidence of DVT. Baker's cyst was seen left knee.  Review of Systems Denies fevers chills shortness breath chest pain. Otherwise please see history of present illness Objective: Vital Signs: Ht 5\' 2"  (1.575 m)   Wt 251 lb (113.9 kg)   BMI 45.91 kg/m   Physical Exam  Constitutional: She is oriented to person, place, and time. She appears well-developed and well-nourished. No distress.  Pulmonary/Chest: Effort normal.  Neurological: She is alert and oriented to person, place, and time.  Skin: She is not diaphoretic.  Psychiatric: She has a normal mood and affect. Her  behavior is normal.    Ortho Exam Bilateral knee she has full extension flexion to approximately 110. No instability valgus varus stressing of either knee. McMurray is negative bilaterally. Tenderness along the medial joint line of the left knee. Tenderness left knee. Patellar region. Phillips Odor is positive on the left negative on the right. No effusion abnormal warmth or erythema of either knee.  Specialty Comments:  No specialty comments available.  Imaging: No results found.   PMFS History: Patient Active Problem List   Diagnosis Date Noted  . Cholecystitis with  cholelithiasis 09/26/2013  . Cholelithiasis with cholecystitis 09/02/2013   Past Medical History:  Diagnosis Date  . Anemia   . Asthma   . Bleeding disorder ([place])    "UNKNOWN PROBLEM" has not seen "blood doctor "yet  . Diabetes mellitus without complication ([place])   . Gallstones   . GERD (gastroesophageal reflux disease)   . Headache(784.0)   . Hx of degenerative disc disease   . Hypertension   . Obesity   . Thyroid disease     Family History  Problem Relation Age of Onset  . Cancer Mother        Pancreatic Cancer    Past Surgical History:  Procedure Laterality Date  . CHOLECYSTECTOMY N/A 09/26/2013   Procedure: LAPAROSCOPIC CHOLECYSTECTOMY WITH INTRAOPERATIVE CHOLANGIOGRAM;  Surgeon: Crystal Regal, MD;  Location: [facility];  Service: General;  Laterality: N/A;   Social History   Occupational History  . Not on file.   Social History Main Topics  . Smoking status: Current Every Day Smoker  . Smokeless tobacco: Never Used  . Alcohol use No     Comment: occ  . Drug use: No  . Sexual activity: Not on file

## 2016-09-27 ENCOUNTER — Ambulatory Visit (INDEPENDENT_AMBULATORY_CARE_PROVIDER_SITE_OTHER): Payer: Medicaid Other | Admitting: Physician Assistant

## 2016-10-31 ENCOUNTER — Emergency Department (HOSPITAL_COMMUNITY): Admission: EM | Admit: 2016-10-31 | Discharge: 2016-10-31 | Discharge status: Against Medical Advice | Payer: Self-pay

## 2016-11-30 ENCOUNTER — Ambulatory Visit (INDEPENDENT_AMBULATORY_CARE_PROVIDER_SITE_OTHER): Payer: Medicaid Other | Admitting: Physician Assistant

## 2016-11-30 ENCOUNTER — Encounter (INDEPENDENT_AMBULATORY_CARE_PROVIDER_SITE_OTHER): Payer: Self-pay | Admitting: Physician Assistant

## 2016-11-30 DIAGNOSIS — M25562 Pain in left knee: Secondary | ICD-10-CM

## 2016-11-30 NOTE — Progress Notes (Signed)
Crystal Brewer returns today for follow-up of her left knee pain.  She was last seen in the office in August at that time was given a cortisone injection in her knee she was to return in 2 weeks however did not return due to the fact that she was being seen at the pain clinic and had been told that she could not be seen in an orthopedic clinic at the same time.  She has had 2 injections since being seen here in the office by the pain physician.  She states none of the injections really helped with her pain.  She is still having giving way of the knee locking and painful popping.  I reviewed her radiographs knee show that the all 3 compartments are well preserved.  Review of systems:Please see HPI otherwise negative  Physical exam: Left knee no effusion abnormal warmth erythema.  No instability valgus varus stressing.  She has full extension flexion 110 degrees actively passively I can flex her back further but this causes pain medial aspect of the knee.  McMurray's negative.  Freddi Starr positive on the left negative on the right.  Impression right knee pain  Impression: Left knee pain  Plan: Due to the fact that she is failed conservative treatment.  She continues to have mechanical symptoms of the knee.  Therefore would recommend that we obtain an MRI to rule out internal derangement.  Have her follow-up after the MRI to go over the results and discuss further treatment.  She obtained an open patella knee brace from a medical supply store which may give her some support from the knee.

## 2016-12-13 ENCOUNTER — Telehealth (INDEPENDENT_AMBULATORY_CARE_PROVIDER_SITE_OTHER): Payer: Self-pay | Admitting: Physician Assistant

## 2016-12-13 NOTE — Telephone Encounter (Signed)
11/30/2016 OV NOTE FAXED TO Alison Stalling [phone]

## 2016-12-14 ENCOUNTER — Ambulatory Visit (INDEPENDENT_AMBULATORY_CARE_PROVIDER_SITE_OTHER): Payer: Medicaid Other | Admitting: Physician Assistant

## 2016-12-28 ENCOUNTER — Other Ambulatory Visit (INDEPENDENT_AMBULATORY_CARE_PROVIDER_SITE_OTHER): Payer: Self-pay | Admitting: *Deleted

## 2016-12-28 DIAGNOSIS — M25562 Pain in left knee: Secondary | ICD-10-CM

## 2016-12-28 DIAGNOSIS — G8929 Other chronic pain: Secondary | ICD-10-CM

## 2017-01-06 ENCOUNTER — Other Ambulatory Visit: Payer: Medicaid Other

## 2017-01-11 ENCOUNTER — Ambulatory Visit: Payer: Medicaid Other | Admitting: Obstetrics

## 2017-02-14 ENCOUNTER — Ambulatory Visit (INDEPENDENT_AMBULATORY_CARE_PROVIDER_SITE_OTHER): Payer: Medicaid Other | Admitting: Physician Assistant

## 2017-11-25 ENCOUNTER — Encounter (HOSPITAL_COMMUNITY): Payer: Self-pay | Admitting: *Deleted

## 2017-11-25 ENCOUNTER — Other Ambulatory Visit: Payer: Self-pay

## 2017-11-25 ENCOUNTER — Emergency Department (HOSPITAL_COMMUNITY)
Admission: EM | Admit: 2017-11-25 | Discharge: 2017-11-25 | Disposition: A | Discharge status: Home / Self Care | Payer: Medicaid Other | Attending: Emergency Medicine | Admitting: Emergency Medicine

## 2017-11-25 ENCOUNTER — Emergency Department (HOSPITAL_COMMUNITY): Payer: Medicaid Other

## 2017-11-25 DIAGNOSIS — F17210 Nicotine dependence, cigarettes, uncomplicated: Secondary | ICD-10-CM | POA: Diagnosis not present

## 2017-11-25 DIAGNOSIS — Y9289 Other specified places as the place of occurrence of the external cause: Secondary | ICD-10-CM | POA: Insufficient documentation

## 2017-11-25 DIAGNOSIS — Z79899 Other long term (current) drug therapy: Secondary | ICD-10-CM | POA: Insufficient documentation

## 2017-11-25 DIAGNOSIS — W19XXXA Unspecified fall, initial encounter: Secondary | ICD-10-CM

## 2017-11-25 DIAGNOSIS — J45909 Unspecified asthma, uncomplicated: Secondary | ICD-10-CM | POA: Diagnosis not present

## 2017-11-25 DIAGNOSIS — E119 Type 2 diabetes mellitus without complications: Secondary | ICD-10-CM | POA: Insufficient documentation

## 2017-11-25 DIAGNOSIS — W010XXA Fall on same level from slipping, tripping and stumbling without subsequent striking against object, initial encounter: Secondary | ICD-10-CM | POA: Insufficient documentation

## 2017-11-25 DIAGNOSIS — I10 Essential (primary) hypertension: Secondary | ICD-10-CM | POA: Insufficient documentation

## 2017-11-25 DIAGNOSIS — Y998 Other external cause status: Secondary | ICD-10-CM | POA: Insufficient documentation

## 2017-11-25 DIAGNOSIS — E669 Obesity, unspecified: Secondary | ICD-10-CM | POA: Insufficient documentation

## 2017-11-25 DIAGNOSIS — Y9301 Activity, walking, marching and hiking: Secondary | ICD-10-CM | POA: Insufficient documentation

## 2017-11-25 DIAGNOSIS — M79661 Pain in right lower leg: Secondary | ICD-10-CM

## 2017-11-25 DIAGNOSIS — S39012A Strain of muscle, fascia and tendon of lower back, initial encounter: Secondary | ICD-10-CM | POA: Insufficient documentation

## 2017-11-25 DIAGNOSIS — S8991XA Unspecified injury of right lower leg, initial encounter: Secondary | ICD-10-CM | POA: Diagnosis present

## 2017-11-25 LAB — PREGNANCY, URINE: Preg Test, Ur: NEGATIVE

## 2017-11-25 MED ORDER — METHOCARBAMOL 500 MG PO TABS: 500.0000 mg | ORAL_TABLET | Freq: Two times a day (BID) | ORAL | 0 refills | Status: DC

## 2017-11-25 MED ORDER — OXYCODONE-ACETAMINOPHEN 5-325 MG PO TABS
1.0000 | ORAL_TABLET | Freq: Once | ORAL | Status: AC
  Administered 2017-11-25: 1 via ORAL
  Filled 2017-11-25: qty 1

## 2017-11-25 NOTE — ED Notes (Signed)
ED Provider at bedside. 

## 2017-11-25 NOTE — ED Triage Notes (Signed)
Pt stepped to go off porch, rt leg went between steps and porch, swelling to rt leg below knee, rt arm pain and low back pain.

## 2017-11-25 NOTE — ED Provider Notes (Signed)
[place] DEPT Provider Note   CSN: [id number] Arrival date & time: 11/25/17  1823     History   Chief Complaint Chief Complaint  Patient presents with  . Fall    HPI Crystal Brewer is a 42 y.o. female.  HPI   Crystal Brewer is a 42yo female with a history of non-insulin-dependent diabetes, hypertension, obesity and asthma who presents to the emergency department after having a mechanical fall earlier today.  Patient states that she was walking up her steps to her porch when her right leg fell between a gap in between the stairs and the porch.  She reports that she twisted and fell onto her right side.  Denies hitting her head or loss of consciousness.  Her husband had to lift her out as her right leg was stuck in the gap.  Patient reports that she now has pain over her right lower leg where she sustained some superficial abrasions.  She reports pain is over the calf where she has had some swelling.  Pain is 8/10 in severity and worsened with weightbearing.  She also reports that she has midline lower back pain which radiates to the right lower back which is worsened with bending and twisting at the waist.  No medications prior to arrival.  She denies numbness, weakness, open bleeding wound, ankle/knee/hip pain, neck pain, headache.  She is able to ambulate independently despite pain.  Past Medical History:  Diagnosis Date  . Anemia   . Asthma   . Bleeding disorder ([place])    "UNKNOWN PROBLEM" has not seen "blood doctor "yet  . Diabetes mellitus without complication ([place])   . Gallstones   . GERD (gastroesophageal reflux disease)   . Headache(784.0)   . Hx of degenerative disc disease   . Hypertension   . Obesity   . Thyroid disease     Patient Active Problem List   Diagnosis Date Noted  . Cholecystitis with cholelithiasis 09/26/2013  . Cholelithiasis with cholecystitis 09/02/2013    Past Surgical History:  Procedure Laterality Date  .  CHOLECYSTECTOMY N/A 09/26/2013   Procedure: LAPAROSCOPIC CHOLECYSTECTOMY WITH INTRAOPERATIVE CHOLANGIOGRAM;  Surgeon: Earnstine Regal, MD;  Location: [facility];  Service: General;  Laterality: N/A;     OB History    Gravida  10   Para  7   Term  7   Preterm  0   AB  3   Living  7     SAB  3   TAB  0   Ectopic  0   Multiple  0   Live Births  7            Home Medications    Prior to Admission medications   Medication Sig Start Date End Date Taking? Authorizing Provider  albuterol (PROVENTIL HFA;VENTOLIN HFA) 108 (90 BASE) MCG/ACT inhaler Inhale 2 puffs into the lungs every 4 (four) hours as needed for wheezing or shortness of breath.    [provider]  canagliflozin (INVOKANA) 100 MG TABS tablet Take 100 mg by mouth daily.    [provider]  cetirizine (ZYRTEC ALLERGY) 10 MG tablet Take 1 tablet (10 mg total) by mouth daily. 08/18/14   [name], PA-C  diclofenac (VOLTAREN) 75 MG EC tablet Take 75 mg by mouth 2 (two) times daily.    [provider]  ibuprofen (ADVIL,MOTRIN) 600 MG tablet Take 1 tablet (600 mg total) by mouth every 8 (eight) hours as needed for mild pain  or moderate pain. Please take with food 08/28/16   Lorin Glass, PA-C  lisinopril-hydrochlorothiazide (PRINZIDE,ZESTORETIC) 20-12.5 MG per tablet Take 1 tablet by mouth every morning.     [provider]  metFORMIN (GLUCOPHAGE) 500 MG tablet Take 500 mg by mouth 2 (two) times daily with a meal.    [provider]  naproxen (NAPROSYN) 500 MG tablet Take 1 tablet (500 mg total) by mouth 2 (two) times daily with a meal. Patient not taking: Reported on 11/30/2016 08/11/16   Muthersbaugh, Jarrett Soho, PA-C  omeprazole (PRILOSEC) 20 MG capsule Take 40 mg by mouth daily.    [provider]    Family History Family History  Problem Relation Age of Onset  . Cancer Mother        Pancreatic Cancer    Social History Social History   Tobacco Use  .  Smoking status: Current Every Day Smoker  . Smokeless tobacco: Never Used  Substance Use Topics  . Alcohol use: No    Comment: occ  . Drug use: No     Allergies   Patient has no known allergies.   Review of Systems Review of Systems  Constitutional: Negative for chills and fever.  Eyes: Negative for visual disturbance.  Respiratory: Negative for shortness of breath.   Cardiovascular: Negative for chest pain.  Gastrointestinal: Negative for abdominal pain, nausea and vomiting.  Musculoskeletal: Positive for back pain and myalgias (right calf). Negative for arthralgias, gait problem and neck pain.  Skin: Negative for wound.  Neurological: Negative for weakness, numbness and headaches.  Psychiatric/Behavioral: Negative for agitation.   Physical Exam Updated Vital Signs BP (!) 150/83   Pulse 93   Temp 99.1 F (37.3 C) (Oral)   Resp 18   Ht 5\' 1"  (1.549 m)   Wt 109.8 kg   LMP 11/13/2017   SpO2 100%   BMI 45.73 kg/m   Physical Exam  Constitutional: She appears well-developed and well-nourished. No distress.  No acute distress.   HENT:  Head: Normocephalic and atraumatic.  Eyes: Right eye exhibits no discharge. Left eye exhibits no discharge.  Neck: Normal range of motion. Neck supple.  No cervical spine tenderness.  Cardiovascular: Normal rate and regular rhythm.  Murmur (systolic) heard. Pulmonary/Chest: Effort normal and breath sounds normal. No stridor. No respiratory distress. She has no wheezes. She has no rales.      Tender to palpation over posterior lower right rib cage. No crepitus. No step off or deformity. No break in skin or overlying ecchymosis. Lungs CTA.  Musculoskeletal:  Tender to palpation over several spinous processes of the lumbar spine as well as right paraspinal muscles of the lumbar spine.  No midline T-spine tenderness.  Right medial calf with mild swelling and superficial scrape which is not bleeding.  No tenderness over the calf or shin.   No tenderness over the right knee joint or right ankle.  Full right knee and ankle range of motion without pain. Strength 5/5 in bilateral knee flexion/extension.  DP pulses 2+ and symmetric bilaterally.  Neurological: She is alert. Coordination normal.  Skin: Skin is warm and dry. She is not diaphoretic.  Psychiatric: She has a normal mood and affect. Her behavior is normal.  Nursing note and vitals reviewed.    ED Treatments / Results  Labs (all labs ordered are listed, but only abnormal results are displayed) Labs Reviewed  PREGNANCY, URINE    EKG None  Radiology Dg Ribs Unilateral W/chest Right  Result Date: 11/25/2017  CLINICAL DATA:  Central low back pain after fall stepping off porch. Posterior lower rib pain. EXAM: RIGHT RIBS AND CHEST - 3+ VIEW COMPARISON:  None. FINDINGS: No fracture or other bone lesions are seen involving the ribs. There is no evidence of pneumothorax or pleural effusion. Both lungs are clear. Heart size and mediastinal contours are within normal limits. Cholecystectomy clips are seen in the right upper quadrant. IMPRESSION: Clear lungs.  No acute displaced rib fracture. Electronically Signed   By: Ashley Royalty M.D.   On: 11/25/2017 22:51   Dg Lumbar Spine Complete  Result Date: 11/25/2017 CLINICAL DATA:  Back pain after fall through porch steps. EXAM: LUMBAR SPINE - COMPLETE 4+ VIEW COMPARISON:  07/29/2014 FINDINGS: Maintained lumbar lordosis. No pars defects or listhesis. There are 5 non ribbed lumbar type vertebrae. There is no evidence of lumbar spine fracture. Cholecystectomy clips are seen. Intervertebral disc spaces are maintained. No diastasis of the included sacroiliac joints. IMPRESSION: Negative. Electronically Signed   By: Ashley Royalty M.D.   On: 11/25/2017 22:54    Procedures Procedures (including critical care time)  Medications Ordered in ED Medications  oxyCODONE-acetaminophen (PERCOCET/ROXICET) 5-325 MG per tablet 1 tablet (1 tablet Oral  Given 11/25/17 2215)     Initial Impression / Assessment and Plan / ED Course  I have reviewed the triage vital signs and the nursing notes.  Pertinent labs & imaging results that were available during my care of the patient were reviewed by me and considered in my medical decision making (see chart for details).     Presents after mechanical fall earlier today. Did not hit her head or lose consciousness. Xray lumbar spine without acute fracture. Xray right ribs without acute fracture or abnormality. In terms of her right lower leg pain, she has superficial abrasions without break in skin. There is no tenderness over the calf, knee or ankle on palpation. Doubt acute fracture. Symptoms consistent with muscular strain. Patient is able to ambulate independently. Plan to discharge with muscle relaxer and NSAIDs for pain. Discussed f/u with PCP if symptoms not improved in a week.   Final Clinical Impressions(s) / ED Diagnoses   Final diagnoses:  Fall, initial encounter  Strain of lumbar region, initial encounter  Pain of right lower leg       Bernarda Caffey 11/25/17 2307    Virgel Manifold, MD 11/25/17 2354

## 2017-11-25 NOTE — Discharge Instructions (Addendum)
Your x-rays were reassuring, no broken bones.  You can take a muscle relaxer for pain.  Remember that this medicine can make you drowsy and he should not drive, work or drink alcohol while taking it.  Use ice over the lower back.  You can also take Tylenol and motrin every 6 hours as needed.

## 2018-04-23 MED FILL — metFORMIN HCL 500 MG TABS: 500 | 30 days supply | Qty: 60 | Fill #0

## 2018-04-23 MED FILL — INVOKANA 300 MG TABLET: 300 | 30 days supply | Qty: 30 | Fill #0

## 2018-04-23 MED FILL — VIT D2 1.25 MG (50,000 UNIT: 1.25 MG | 28 days supply | Qty: 4 | Fill #0

## 2018-05-01 ENCOUNTER — Other Ambulatory Visit: Payer: Self-pay

## 2018-05-01 ENCOUNTER — Ambulatory Visit
Admission: RE | Admit: 2018-05-01 | Discharge: 2018-05-01 | Disposition: A | Discharge status: Home / Self Care | Payer: Medicaid Other | Source: Ambulatory Visit | Attending: Internal Medicine | Admitting: Internal Medicine

## 2018-05-01 ENCOUNTER — Other Ambulatory Visit: Payer: Self-pay | Admitting: Internal Medicine

## 2018-05-01 DIAGNOSIS — M25562 Pain in left knee: Secondary | ICD-10-CM

## 2018-05-27 MED FILL — metFORMIN HCL 500 MG TABS: 500 | 30 days supply | Qty: 60 | Fill #1

## 2018-05-27 MED FILL — INVOKANA 300 MG TABLET: 300 | 30 days supply | Qty: 30 | Fill #1

## 2018-05-27 MED FILL — VIT D2 1.25 MG (50,000 UNIT: 1.25 MG | 28 days supply | Qty: 4 | Fill #1

## 2018-05-30 ENCOUNTER — Other Ambulatory Visit: Payer: Self-pay | Admitting: Internal Medicine

## 2018-05-30 DIAGNOSIS — Z1231 Encounter for screening mammogram for malignant neoplasm of breast: Secondary | ICD-10-CM

## 2018-07-01 MED FILL — VIT D2 1.25 MG (50,000 UNIT: 1.25 MG | 28 days supply | Qty: 4 | Fill #2

## 2018-07-01 MED FILL — INVOKANA 300 MG TABLET: 300 | 30 days supply | Qty: 30 | Fill #2

## 2018-07-01 MED FILL — metFORMIN HCL 500 MG TABS: 500 | 30 days supply | Qty: 60 | Fill #2

## 2018-07-29 ENCOUNTER — Ambulatory Visit: Payer: Medicaid Other

## 2018-08-23 ENCOUNTER — Ambulatory Visit: Payer: Medicaid Other

## 2018-11-27 ENCOUNTER — Ambulatory Visit: Payer: Medicaid Other | Admitting: Physician Assistant

## 2018-12-02 ENCOUNTER — Ambulatory Visit: Payer: Medicaid Other | Admitting: Physician Assistant

## 2018-12-11 ENCOUNTER — Encounter: Payer: Self-pay | Admitting: Physician Assistant

## 2018-12-11 ENCOUNTER — Ambulatory Visit: Payer: Medicaid Other | Admitting: Physician Assistant

## 2018-12-11 ENCOUNTER — Ambulatory Visit (INDEPENDENT_AMBULATORY_CARE_PROVIDER_SITE_OTHER): Payer: Medicaid Other

## 2018-12-11 ENCOUNTER — Other Ambulatory Visit: Payer: Self-pay

## 2018-12-11 DIAGNOSIS — G8929 Other chronic pain: Secondary | ICD-10-CM

## 2018-12-11 DIAGNOSIS — M25561 Pain in right knee: Secondary | ICD-10-CM | POA: Diagnosis not present

## 2018-12-11 MED ORDER — METHYLPREDNISOLONE ACETATE 40 MG/ML IJ SUSP
40.0000 mg | INTRAMUSCULAR | Status: AC | PRN
  Administered 2018-12-11: 17:00:00 40 mg via INTRA_ARTICULAR

## 2018-12-11 MED ORDER — LIDOCAINE HCL 1 % IJ SOLN
3.0000 mL | INTRAMUSCULAR | Status: AC | PRN
  Administered 2018-12-11: 17:00:00 3 mL

## 2018-12-11 NOTE — Progress Notes (Signed)
Office Visit Note   Patient: Crystal Brewer           Date of Birth: 10/11/1975           MRN: [id number] Visit Date: 12/11/2018              Requested by: Nolene Ebbs, MD [address] [place],  [place] [address] PCP: Nolene Ebbs, MD   Assessment & Plan: Visit Diagnoses:  1. Chronic pain of right knee     Plan: We will have her work on quad strengthening exercises.  Try to obtain an MRI of her left knee to rule out meniscal tear.  Due to her continued pain in the knee despite time and home exercises.  Also given her effusion today of the knee and her mechanical symptoms of the left knee.  Asked her to start taking Aleve 2 tablets twice daily with food for the next 2 weeks.  Follow-Up Instructions: Return After MRI.   Orders:  Orders Placed This Encounter  Procedures  . Large Joint Inj  . XR Knee 1-2 Views Right   No orders of the defined types were placed in this encounter.     Procedures: Large Joint Inj on 12/11/2018 5:11 PM Indications: pain Details: 22 G 1.5 in needle, anterolateral approach  Arthrogram: No  Medications: 3 mL lidocaine 1 %; 40 mg methylPREDNISolone acetate 40 MG/ML Aspirate: 15 mL yellow Outcome: tolerated well, no immediate complications Procedure, treatment alternatives, risks and benefits explained, specific risks discussed. Consent was given by the patient. Immediately prior to procedure a time out was called to verify the correct patient, procedure, equipment, support staff and site/side marked as required. Patient was prepped and draped in the usual sterile fashion.       Clinical Data: No additional findings.   Subjective: Chief Complaint  Patient presents with  . Right Knee - Pain  . Left Knee - Pain    HPI Crystal Brewer returns today due to bilateral knee pain.  We last saw her in November 2018 tried to get an MRI at that time for her continued left knee pain despite conservative treatment this was denied.  She continues to  have pain in her left knee but also is developing right knee pain.  She has trouble going up and down stairs.  Has a popping-like sensation in the knee at times feels that her knee is locked up and give way.  She is had no known injury to either knee.  She is in pain management clinic.  Review of Systems Negative for fevers chills shortness of breath chest pain  Objective: Vital Signs: There were no vitals taken for this visit.  Physical Exam Constitutional:      Appearance: She is not ill-appearing or diaphoretic.  Pulmonary:     Effort: Pulmonary effort is normal.  Neurological:     Mental Status: She is alert and oriented to person, place, and time.  Psychiatric:        Mood and Affect: Mood normal.        Behavior: Behavior normal.     Ortho Exam Bilateral knees slight patellofemoral crepitus with passive range of motion.  No instability valgus varus stressing.  Tenderness along medial joint line of both knees.  McMurray's is negative bilaterally.  Slight effusion left knee no effusion right knee. Specialty Comments:  No specialty comments available.  Imaging: Xr Knee 1-2 Views Right  Result Date: 12/11/2018 Right knee AP lateral views: No acute fracture.  Very slight narrowing medial joint line.  No other bony abnormalities.    PMFS History: Patient Active Problem List   Diagnosis Date Noted  . Cholecystitis with cholelithiasis 09/26/2013  . Cholelithiasis with cholecystitis 09/02/2013   Past Medical History:  Diagnosis Date  . Anemia   . Asthma   . Bleeding disorder ([place])    "UNKNOWN PROBLEM" has not seen "blood doctor "yet  . Diabetes mellitus without complication ([place])   . Gallstones   . GERD (gastroesophageal reflux disease)   . Headache(784.0)   . Hx of degenerative disc disease   . Hypertension   . Obesity   . Thyroid disease     Family History  Problem Relation Age of Onset  . Cancer Mother        Pancreatic Cancer    Past Surgical History:   Procedure Laterality Date  . CHOLECYSTECTOMY N/A 09/26/2013   Procedure: LAPAROSCOPIC CHOLECYSTECTOMY WITH INTRAOPERATIVE CHOLANGIOGRAM;  Surgeon: Earnstine Regal, MD;  Location: [facility];  Service: General;  Laterality: N/A;   Social History   Occupational History  . Not on file  Tobacco Use  . Smoking status: Current Every Day Smoker  . Smokeless tobacco: Never Used  Substance and Sexual Activity  . Alcohol use: No    Comment: occ  . Drug use: No  . Sexual activity: Not on file

## 2018-12-12 ENCOUNTER — Other Ambulatory Visit: Payer: Self-pay | Admitting: Radiology

## 2018-12-12 DIAGNOSIS — M25562 Pain in left knee: Secondary | ICD-10-CM

## 2019-01-04 ENCOUNTER — Ambulatory Visit
Admission: RE | Admit: 2019-01-04 | Discharge: 2019-01-04 | Disposition: A | Discharge status: Home / Self Care | Payer: Medicaid Other | Source: Ambulatory Visit | Attending: Physician Assistant | Admitting: Physician Assistant

## 2019-01-04 ENCOUNTER — Other Ambulatory Visit: Payer: Self-pay

## 2019-01-04 DIAGNOSIS — M25562 Pain in left knee: Secondary | ICD-10-CM

## 2019-02-10 ENCOUNTER — Encounter: Payer: Self-pay | Admitting: Physician Assistant

## 2019-02-10 ENCOUNTER — Other Ambulatory Visit: Payer: Self-pay

## 2019-02-10 ENCOUNTER — Ambulatory Visit (INDEPENDENT_AMBULATORY_CARE_PROVIDER_SITE_OTHER): Payer: Medicaid Other | Admitting: Physician Assistant

## 2019-02-10 DIAGNOSIS — M23204 Derangement of unspecified medial meniscus due to old tear or injury, left knee: Secondary | ICD-10-CM

## 2019-02-10 NOTE — Progress Notes (Signed)
HPI: Crystal Brewer returns today to go over the MRI of her left knee.  She continues to have pain mostly medial aspect of the knee with some catching and giving way like sensation in the knee.  However she is complaining more right knee pain today.  She has had no treatment for the right knee past. MRI results and images are reviewed with the patient.  MRI showed marked cartilage thinning seen on the lateral patella facet and femoral trochlea.  Medial compartment was thinning with joint space narrowing.  Mild degenerative changes lateral compartment.  Small joint effusion present.  Horizontal tear posterior horn medial meniscus reaches the meniscus undersurface.  Also degenerative signal seen within the medial meniscal body.  Physical exam: Left knee good range of motion.  Tenderness along the medial joint line with palpation.  No instability valgus varus stressing.  Impression: Left knee medial meniscal tear  Plan: Recommend left knee arthroscopy with extensive debridement and partial medial meniscectomy.  Discussed with her the risk benefits of surgery.  Risks discussed with her include prolonged pain, DVT/PE and infection.  She is diabetic she is unsure of what her glucose levels are running.  She is due to see her primary care physician next week.  May be able to perform a right knee cortisone injection at the time of surgery if her glucose levels are under good control.  She follow-up with [place] 1 week postop.

## 2019-02-18 ENCOUNTER — Encounter: Payer: Self-pay | Admitting: Orthopaedic Surgery

## 2019-02-18 ENCOUNTER — Ambulatory Visit (INDEPENDENT_AMBULATORY_CARE_PROVIDER_SITE_OTHER): Payer: Medicaid Other

## 2019-02-18 ENCOUNTER — Encounter: Payer: Self-pay | Admitting: Physician Assistant

## 2019-02-18 ENCOUNTER — Ambulatory Visit (INDEPENDENT_AMBULATORY_CARE_PROVIDER_SITE_OTHER): Payer: Medicaid Other | Admitting: Physician Assistant

## 2019-02-18 ENCOUNTER — Other Ambulatory Visit: Payer: Self-pay

## 2019-02-18 DIAGNOSIS — G8929 Other chronic pain: Secondary | ICD-10-CM

## 2019-02-18 DIAGNOSIS — M25561 Pain in right knee: Secondary | ICD-10-CM

## 2019-02-18 MED ORDER — METHYLPREDNISOLONE ACETATE 40 MG/ML IJ SUSP
40.0000 mg | INTRAMUSCULAR | Status: AC | PRN
  Administered 2019-02-18: 40 mg via INTRA_ARTICULAR

## 2019-02-18 MED ORDER — LIDOCAINE HCL 1 % IJ SOLN
3.0000 mL | INTRAMUSCULAR | Status: AC | PRN
  Administered 2019-02-18: 17:00:00 3 mL

## 2019-02-18 NOTE — Progress Notes (Signed)
   Procedure Note  Patient: Crystal Brewer             Date of Birth: 08/13/1975           MRN: [id number]             Visit Date: 02/18/2019 HPI: Crystal Brewer returns today due to increasing right knee pain.  She has had no known injury to the knee.  She is having now pain lateral aspect of the knee and also the medial aspect.  Pains radiating down into the lower leg.  No numbness tingling.  She is waiting surgery for her left knee which began a left knee arthroscopy with extensive debridement and partial medial meniscectomy.  She reports that her last hemoglobin A1c was 7.9.  Physical exam: Right knee full extension full flexion.  No instability valgus varus stressing.  Tenderness globally.  No abnormal warmth erythema or effusion of the knee.  Radiographs: Right knee AP lateral views: No acute fracture. Slight Medial joint line narrowing . No bony abnormalities otherwise.    Procedures: Visit Diagnoses:  1. Chronic pain of right knee     Large Joint Inj: R knee on 02/18/2019 4:41 PM Indications: pain Details: 22 G 1.5 in needle, anterolateral approach  Arthrogram: No  Medications: 3 mL lidocaine 1 %; 40 mg methylPREDNISolone acetate 40 MG/ML Outcome: tolerated well, no immediate complications Procedure, treatment alternatives, risks and benefits explained, specific risks discussed. Consent was given by the patient. Immediately prior to procedure a time out was called to verify the correct patient, procedure, equipment, support staff and site/side marked as required. Patient was prepped and draped in the usual sterile fashion.    Plan: She will monitor her glucose levels closely over the next few days her glucose becomes elevated she will call her primary care doctor to discuss adjustments of her medication.  We will see her back in 1 week status post left knee arthroscopy.  Questions encouraged and answered at length.

## 2019-02-19 ENCOUNTER — Ambulatory Visit (INDEPENDENT_AMBULATORY_CARE_PROVIDER_SITE_OTHER): Payer: Medicaid Other

## 2019-02-19 ENCOUNTER — Ambulatory Visit (HOSPITAL_COMMUNITY)
Admission: EM | Admit: 2019-02-19 | Discharge: 2019-02-19 | Disposition: A | Discharge status: Home / Self Care | Payer: Medicaid Other | Attending: Family Medicine | Admitting: Family Medicine

## 2019-02-19 ENCOUNTER — Encounter (HOSPITAL_COMMUNITY): Payer: Self-pay | Admitting: Emergency Medicine

## 2019-02-19 ENCOUNTER — Other Ambulatory Visit: Payer: Self-pay

## 2019-02-19 DIAGNOSIS — S6991XA Unspecified injury of right wrist, hand and finger(s), initial encounter: Secondary | ICD-10-CM

## 2019-02-19 DIAGNOSIS — M79644 Pain in right finger(s): Secondary | ICD-10-CM

## 2019-02-19 MED ORDER — MELOXICAM 15 MG PO TABS: 15.0000 mg | ORAL_TABLET | Freq: Every day | ORAL | 0 refills | Status: DC

## 2019-02-19 NOTE — ED Provider Notes (Signed)
[facility]   [id number] 02/19/19 Arrival Time: [id number]  ASSESSMENT & PLAN:  1. Pain of right thumb   2. Injury of right thumb, initial encounter     I have personally viewed the imaging studies ordered this visit. Question small proximal thumb fracture. Discussed.   Meds ordered this encounter  Medications  . meloxicam (MOBIC) 15 MG tablet    Sig: Take 1 tablet (15 mg total) by mouth daily.    Dispense:  14 tablet    Refill:  0   Placed in thumb spica splint.  Recommend: Follow-up Information    Schedule an appointment as soon as possible for a visit  with Iran Planas, MD.   Specialty: Orthopedic Surgery Contact information: [address] [place] [address] [phone]           Reviewed expectations re: course of current medical issues. Questions answered. Outlined signs and symptoms indicating need for more acute intervention. Patient verbalized understanding. After Visit Summary given.  SUBJECTIVE: History from: patient. Crystal Brewer is a 44 y.o. R-handed female who reports persistent moderate pain of her proximal right thumb; described as aching; without radiation. Onset: abrupt. First noted: today. Injury/trama: reports forcefully hitting R thumb on washing machine; immediate pain. Symptoms have progressed to a point and plateaued since beginning. Aggravating factors: certain movements and grasping. Alleviating factors: have not been identified. Associated symptoms: none reported. Extremity sensation changes or weakness: none. Self treatment: has not tried OTC therapies.  History of similar: no.   Past Surgical History:  Procedure Laterality Date  . CHOLECYSTECTOMY N/A 09/26/2013   Procedure: LAPAROSCOPIC CHOLECYSTECTOMY WITH INTRAOPERATIVE CHOLANGIOGRAM;  Surgeon: Earnstine Regal, MD;  Location: [facility];  Service: General;  Laterality: N/A;     ROS: As per HPI. All other systems negative.    OBJECTIVE:  Vitals:   02/19/19 1648  BP: 105/72  Pulse: 90  Temp: 98 F (36.7 C)  TempSrc: Oral  SpO2: 100%    General appearance: alert; no distress HEENT: [place]; AT Neck: supple with FROM Resp: unlabored respirations Extremities: . RUE: warm with well perfused appearance; poorly localized moderate tenderness over base of R thumb; without gross deformities; swelling: none; bruising: none; thumb ROM: normal, with discomfort CV: brisk extremity capillary refill of RUE; 2+ radial pulse of RUE. Skin: warm and dry; no visible rashes Neurologic: gait normal; normal sensation of distal RUE Psychological: alert and cooperative; normal mood and affect  Imaging: DG Finger Thumb Right  Result Date: 02/19/2019 CLINICAL DATA:  Status post trauma. EXAM: RIGHT THUMB 2+V COMPARISON:  None. FINDINGS: An ill-defined, approximately 1.2 mm focal opacity is seen adjacent to the lateral aspect of the base of the proximal phalanx of the right thumb. This is only seen on the frontal view. There is no evidence of dislocation. There is no evidence of arthropathy. Soft tissues are unremarkable. IMPRESSION: 1. Tiny focal opacity adjacent to the base of the proximal phalanx of the right thumb. A tiny fracture fragment cannot completely be excluded. Correlation with physical examination of this region to determine the presence of point tenderness is recommended. Electronically Signed   By: Virgina Norfolk M.D.   On: 02/19/2019 17:06      No Known Allergies  Past Medical History:  Diagnosis Date  . Anemia   . Asthma   . Bleeding disorder ([place])    "UNKNOWN PROBLEM" has not seen "blood doctor "yet  . Diabetes mellitus without complication ([place])   . Gallstones   .  GERD (gastroesophageal reflux disease)   . Headache(784.0)   . Hx of degenerative disc disease   . Hypertension   . Obesity   . Thyroid disease    Social History   Socioeconomic History  . Marital status: Married    Spouse name: Not on file  . Number of children:  Not on file  . Years of education: Not on file  . Highest education level: Not on file  Occupational History  . Not on file  Tobacco Use  . Smoking status: Current Every Day Smoker  . Smokeless tobacco: Never Used  Substance and Sexual Activity  . Alcohol use: No    Comment: occ  . Drug use: No  . Sexual activity: Not on file  Other Topics Concern  . Not on file  Social History Narrative  . Not on file   Social Determinants of Health   Financial Resource Strain:   . Difficulty of Paying Living Expenses: Not on file  Food Insecurity:   . Worried About Charity fundraiser in the Last Year: Not on file  . Ran Out of Food in the Last Year: Not on file  Transportation Needs:   . Lack of Transportation (Medical): Not on file  . Lack of Transportation (Non-Medical): Not on file  Physical Activity:   . Days of Exercise per Week: Not on file  . Minutes of Exercise per Session: Not on file  Stress:   . Feeling of Stress : Not on file  Social Connections:   . Frequency of Communication with Friends and Family: Not on file  . Frequency of Social Gatherings with Friends and Family: Not on file  . Attends Religious Services: Not on file  . Active Member of Clubs or Organizations: Not on file  . Attends Archivist Meetings: Not on file  . Marital Status: Not on file   Family History  Problem Relation Age of Onset  . Cancer Mother        Pancreatic Cancer   Past Surgical History:  Procedure Laterality Date  . CHOLECYSTECTOMY N/A 09/26/2013   Procedure: LAPAROSCOPIC CHOLECYSTECTOMY WITH INTRAOPERATIVE CHOLANGIOGRAM;  Surgeon: Earnstine Regal, MD;  Location: [facility];  Service: General;  Laterality: Ginette Pitman, MD 02/20/19 1027

## 2019-02-19 NOTE — ED Triage Notes (Signed)
Pain in right thumb.  Patient accidentally hit thumb on the edge of a washing machine

## 2019-02-27 ENCOUNTER — Other Ambulatory Visit: Payer: Self-pay | Admitting: Orthopaedic Surgery

## 2019-02-27 ENCOUNTER — Telehealth: Payer: Self-pay | Admitting: Orthopaedic Surgery

## 2019-02-27 ENCOUNTER — Telehealth: Payer: Self-pay

## 2019-02-27 DIAGNOSIS — S83232D Complex tear of medial meniscus, current injury, left knee, subsequent encounter: Secondary | ICD-10-CM

## 2019-02-27 MED ORDER — HYDROCODONE-ACETAMINOPHEN 5-325 MG PO TABS: 1.0000 | ORAL_TABLET | Freq: Four times a day (QID) | ORAL | 0 refills | Status: DC | PRN

## 2019-02-27 NOTE — Telephone Encounter (Signed)
Pharmacy called.   She was recently prescribed a 30 days supply of oxycodone. The pharmacy feels this conflicts with the prescription Crystal Brewer just sent in.   Pharmacy call back: [phone]

## 2019-02-27 NOTE — Telephone Encounter (Signed)
error 

## 2019-02-27 NOTE — Telephone Encounter (Signed)
Told pharmacy not to fill the hydrocodone Apparently she gets oxycodone 10/352 on a regular basis

## 2019-02-27 NOTE — Telephone Encounter (Signed)
Thanks.  That seems appropriate.

## 2019-03-03 ENCOUNTER — Telehealth: Payer: Self-pay | Admitting: Orthopaedic Surgery

## 2019-03-03 NOTE — Telephone Encounter (Signed)
LMOM for patient to call back and let us know what the note needs to say and I will write that for her and put it at the front desk

## 2019-03-03 NOTE — Telephone Encounter (Signed)
Pt called wondering if there was anyway she could pick up a note for work prior to her appt. on 03-06-19 @ 1 pm.   [phone]

## 2019-03-06 ENCOUNTER — Encounter: Payer: Self-pay | Admitting: Physician Assistant

## 2019-03-06 ENCOUNTER — Ambulatory Visit (INDEPENDENT_AMBULATORY_CARE_PROVIDER_SITE_OTHER): Payer: Medicaid Other | Admitting: Physician Assistant

## 2019-03-06 ENCOUNTER — Other Ambulatory Visit: Payer: Self-pay

## 2019-03-06 DIAGNOSIS — Z9889 Other specified postprocedural states: Secondary | ICD-10-CM

## 2019-03-06 NOTE — Progress Notes (Signed)
HPI: [name] returns today 1 week status post left knee arthroscopy with partial medial meniscectomy.  Found to have a medial meniscal tear and grade III chondromalacia medial tibial plateau full thickness cartilage loss of the weightbearing aspect medial femoral condyle and grade 3-4 changes involving the patellofemoral joint.  Is overall doing well she is asking to return to work on March 10, 2019 full duties.  Negative for fevers chills shortness of breath chest pain  Physical exam: Left knee full extension flexion to beyond 90 degrees.  Calf supple nontender.  Port sites healing well no signs of infection.  No signs of dehiscence.  Sutures well approximated incisions and are  removed today.  Impression: Status post left knee arthroscopy with partial medial meniscectomy  Plan: She will work on range of motion strengthening the knee.  Follow-up with [place] in 1 month sooner if there is any questions concerns.  She may benefit from supplemental injection in the future.  Discussed weight loss with her.

## 2019-07-10 ENCOUNTER — Ambulatory Visit: Payer: Medicaid Other | Admitting: Physician Assistant

## 2019-07-23 ENCOUNTER — Ambulatory Visit: Payer: Medicaid Other | Admitting: Physician Assistant

## 2019-09-01 ENCOUNTER — Ambulatory Visit: Payer: Medicaid Other | Admitting: Physician Assistant

## 2019-09-10 ENCOUNTER — Ambulatory Visit: Payer: Medicaid Other | Admitting: Physician Assistant

## 2019-11-26 ENCOUNTER — Other Ambulatory Visit (HOSPITAL_COMMUNITY): Payer: Self-pay | Admitting: Nurse Practitioner

## 2020-07-01 IMAGING — MR MR KNEE*L* W/O CM
7 series · 40 of 40 positions shown · non-contrast
Comparison: Plain films left knee 05/01/2018

CLINICAL DATA: Left knee pain for approximately 1 year. No known
injury.

EXAM:
MRI OF THE LEFT KNEE WITHOUT CONTRAST
TECHNIQUE: Multiplanar, multisequence MR imaging of the knee was performed. No
intravenous contrast was administered.

[Series 6: T2 fat-sat · axial · left · 4.0mm · 0.53mm/px · z∈[-56,+97]mm · 6 of 36 slices shown (1 of 3)]
[im 1/36]
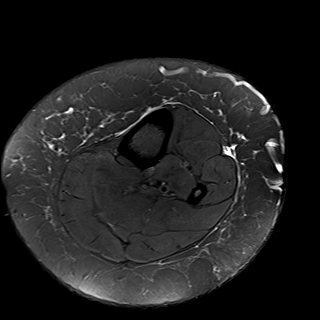
[im 8/36]
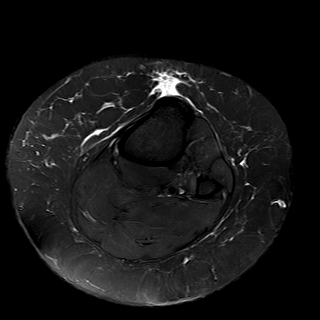
[im 15/36]
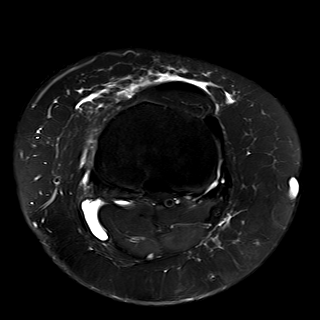
[im 22/36]
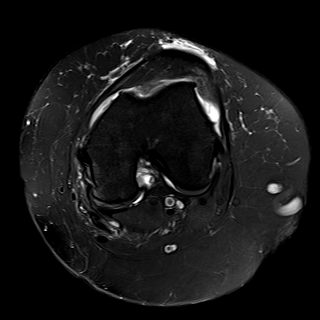
[im 29/36]
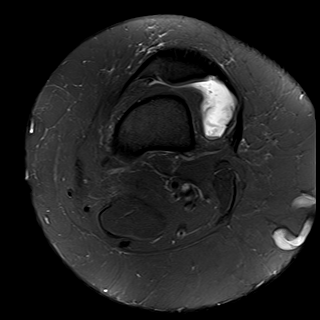
[im 36/36]
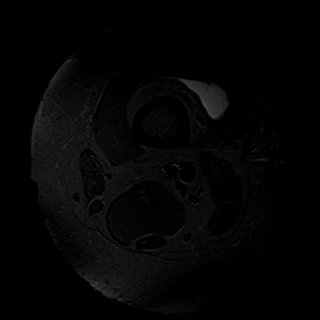

[Series 7: T2 fat-sat · coronal · left · 4.0mm · 0.50mm/px · 6 of 28 slices shown (2 of 3)]
[im 1/28]
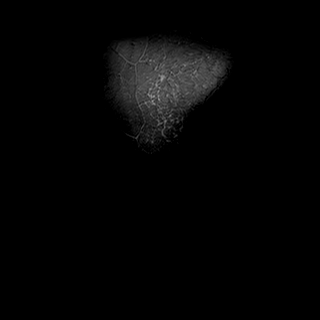
[im 6/28]
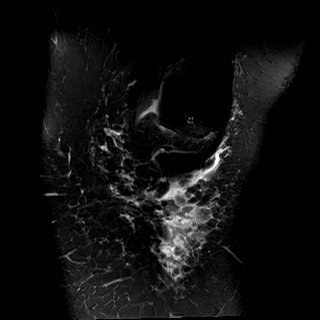
[im 11/28]
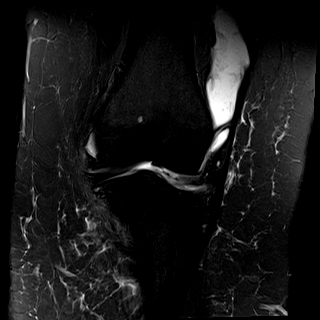
[im 17/28]
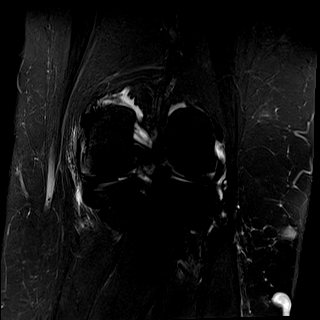
[im 22/28]
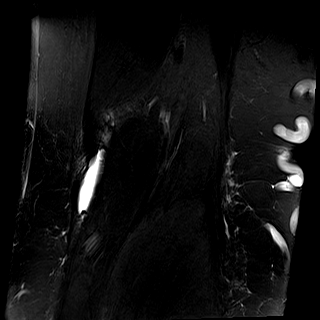
[im 28/28]
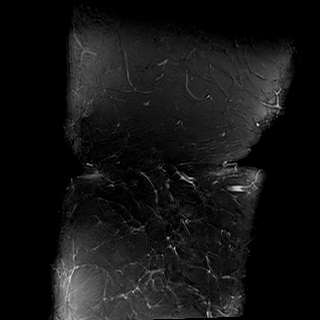

[Series 8: T1 · coronal · left · 4.0mm · 0.50mm/px · 6 of 28 slices shown]
[im 1/28]
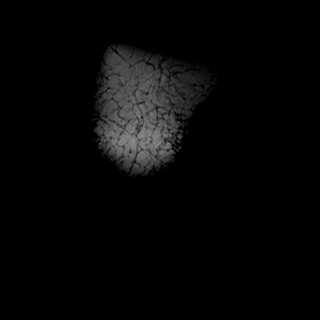
[im 6/28]
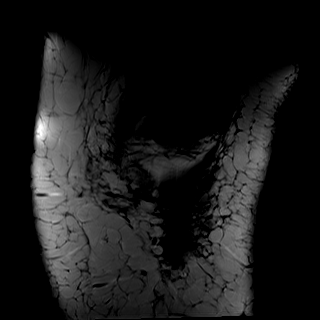
[im 11/28]
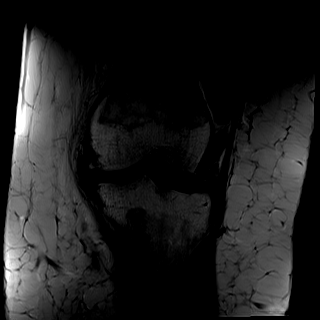
[im 17/28]
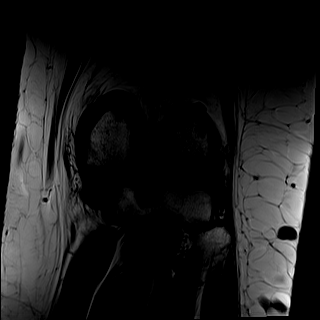
[im 22/28]
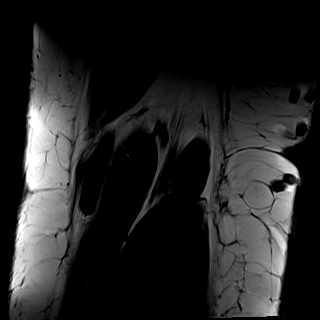
[im 28/28]
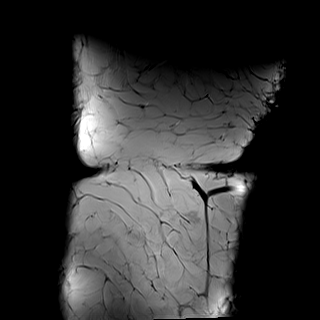

[Series 9: PD fat-sat · coronal · left · 3.0mm · 0.50mm/px · 6 of 28 slices shown (1 of 2)]
[im 1/28]
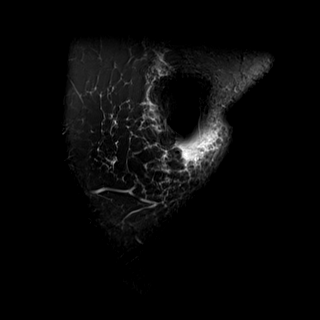
[im 6/28]
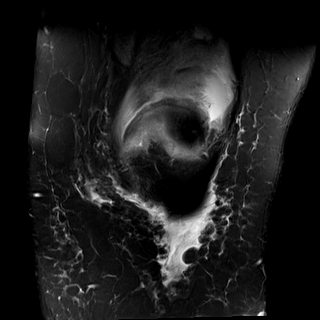
[im 11/28]
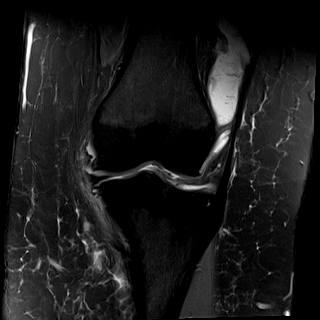
[im 17/28]
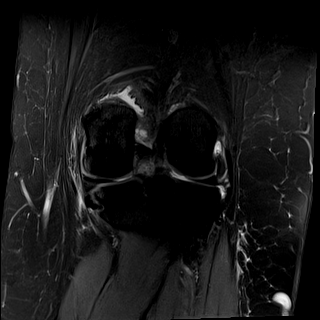
[im 22/28]
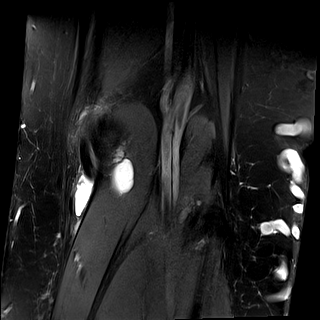
[im 28/28]
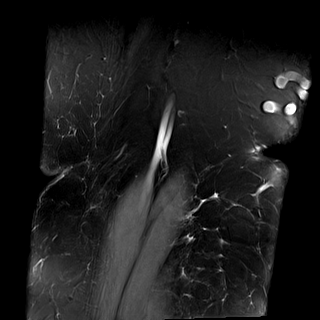

[Series 10: PD fat-sat · sagittal · left · 3.0mm · 0.50mm/px · 6 of 27 slices shown (2 of 2)]
[im 1/27]
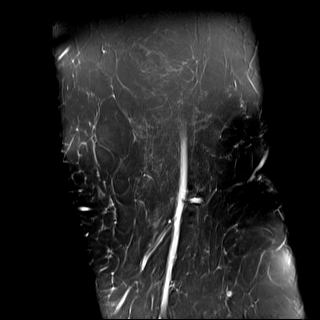
[im 6/27]
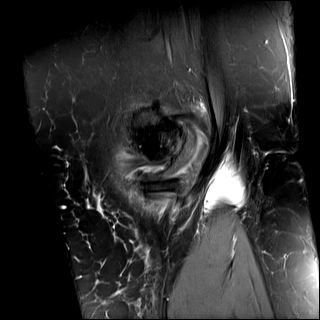
[im 11/27]
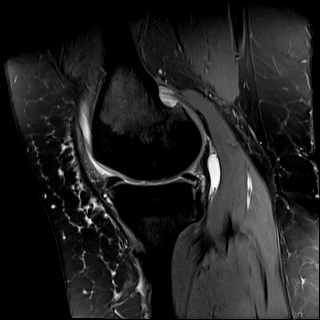
[im 16/27]
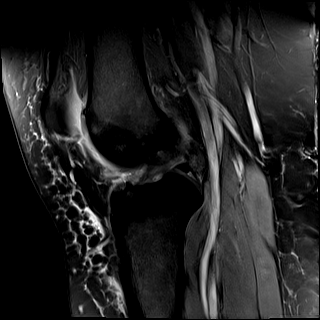
[im 21/27]
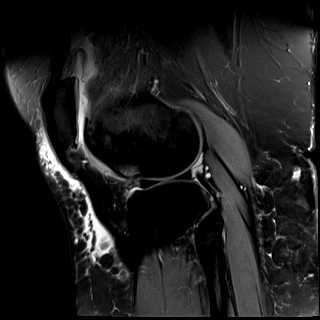
[im 27/27]
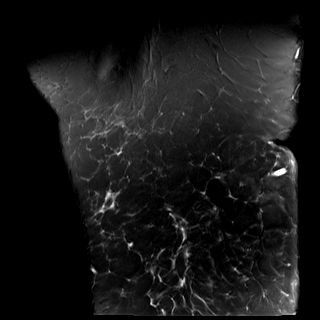

[Series 11: T2 fat-sat · sagittal · left · 3.0mm · 0.50mm/px · 6 of 27 slices shown (3 of 3)]
[im 1/27]
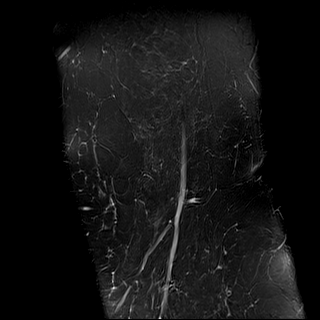
[im 6/27]
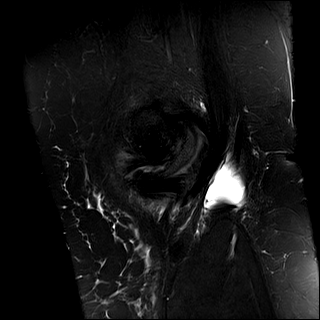
[im 11/27]
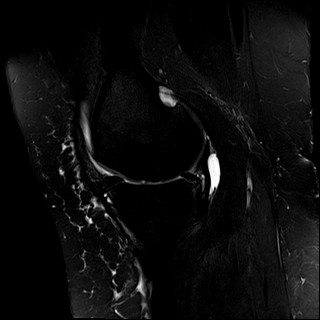
[im 16/27]
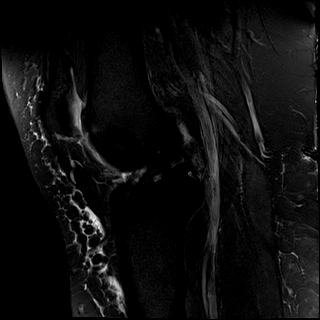
[im 21/27]
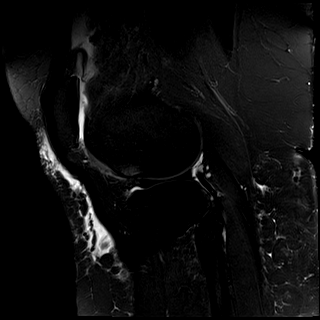
[im 27/27]
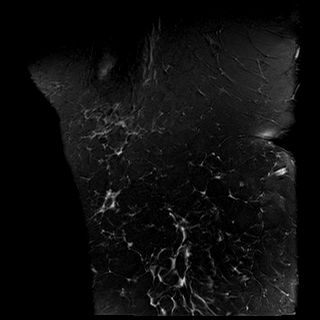

[Series 12: PD · oblique · left · 1.5mm · 0.44mm/px · 4 of 21 slices shown]
[im 1/21]
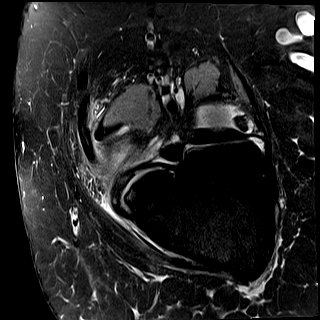
[im 7/21]
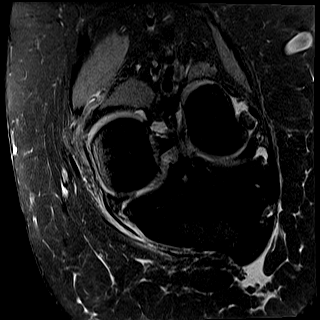
[im 14/21]
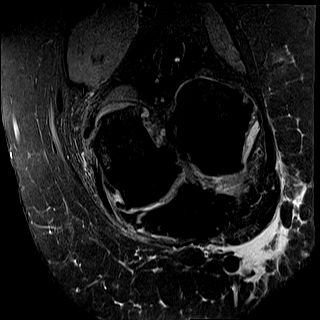
[im 21/21]
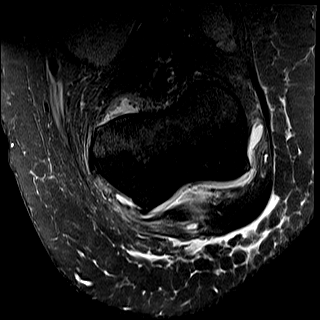

[40 of 40 positions shown; findings below may reference images not displayed]

FINDINGS: MENISCI

Medial meniscus: Horizontal tear in the posterior horn reaches the
meniscal undersurface. Fairly extensive degenerative signal is seen
in the body.

Lateral meniscus:  There is fraying along the free edge of the body.

LIGAMENTS

Cruciates:  Intact.

Collaterals:  Intact.

CARTILAGE

Patellofemoral: Marked cartilage thinning is seen along the lateral
patellar facet and femoral trochlea. The patella is laterally
subluxed.

Medial:  Thinned with associated joint space narrowing.

Lateral:  Mildly degenerated.

Joint:  Small effusion.

Popliteal Fossa: Baker's cyst measures approximately 2 cm AP x
cm transverse x 3.5 cm craniocaudal.

Extensor Mechanism:  Intact.

Bones: No fracture or focal lesion. Osteophytosis about the knee is
worst medially.

Other: None.
IMPRESSION: 1. Horizontal tear posterior horn medial meniscus reaches the
meniscal undersurface.
2. Fraying along the free edge of the body of the lateral meniscus.
3. Osteoarthritis about the knee is worst in the patellofemoral and
medial compartments.
4. Baker's cyst.

## 2020-12-31 ENCOUNTER — Other Ambulatory Visit: Payer: Self-pay | Admitting: Internal Medicine

## 2021-01-01 LAB — COMPLETE METABOLIC PANEL WITH GFR
AG Ratio: 1.4 (calc) (ref 1.0–2.5)
ALT: 14 U/L (ref 6–29)
AST: 15 U/L (ref 10–35)
Albumin: 4.1 g/dL (ref 3.6–5.1)
Alkaline phosphatase (APISO): 96 U/L (ref 31–125)
BUN: 11 mg/dL (ref 7–25)
CO2: 21 mmol/L (ref 20–32)
Calcium: 9.1 mg/dL (ref 8.6–10.2)
Chloride: 104 mmol/L (ref 98–110)
Creat: 0.75 mg/dL (ref 0.50–0.99)
Globulin: 2.9 g/dL (calc) (ref 1.9–3.7)
Glucose, Bld: 275 mg/dL — ABNORMAL HIGH (ref 65–99)
Potassium: 3.8 mmol/L (ref 3.5–5.3)
Sodium: 137 mmol/L (ref 135–146)
Total Bilirubin: 0.4 mg/dL (ref 0.2–1.2)
Total Protein: 7 g/dL (ref 6.1–8.1)
eGFR: 99 mL/min/{1.73_m2} (ref >=60)

## 2021-01-01 LAB — LIPID PANEL
Cholesterol: 197 mg/dL (ref <200)
HDL: 41 mg/dL — ABNORMAL LOW (ref >=50)
LDL Cholesterol (Calc): 123 mg/dL (calc) — ABNORMAL HIGH
Non-HDL Cholesterol (Calc): 156 mg/dL (calc) — ABNORMAL HIGH (ref <130)
Total CHOL/HDL Ratio: 4.8 (calc) (ref <5.0)
Triglycerides: 221 mg/dL — ABNORMAL HIGH (ref <150)

## 2021-01-01 LAB — CBC
HCT: 42.8 % (ref 35.0–45.0)
Hemoglobin: 14.2 g/dL (ref 11.7–15.5)
MCH: 31.1 pg (ref 27.0–33.0)
MCHC: 33.2 g/dL (ref 32.0–36.0)
MCV: 93.9 fL (ref 80.0–100.0)
MPV: 10.3 fL (ref 7.5–12.5)
Platelets: 379 10*3/uL (ref 140–400)
RBC: 4.56 10*6/uL (ref 3.80–5.10)
RDW: 12.9 % (ref 11.0–15.0)
WBC: 7.4 10*3/uL (ref 3.8–10.8)

## 2021-01-01 LAB — TSH: TSH: 0.66 mIU/L

## 2021-01-01 LAB — VITAMIN D 25 HYDROXY (VIT D DEFICIENCY, FRACTURES): Vit D, 25-Hydroxy: 44 ng/mL (ref 30–100)

## 2021-01-06 ENCOUNTER — Other Ambulatory Visit: Payer: Self-pay | Admitting: Internal Medicine

## 2021-01-06 DIAGNOSIS — Z1231 Encounter for screening mammogram for malignant neoplasm of breast: Secondary | ICD-10-CM

## 2021-01-12 ENCOUNTER — Other Ambulatory Visit (HOSPITAL_COMMUNITY): Payer: Self-pay

## 2021-03-06 NOTE — Progress Notes (Deleted)
° °  GYNECOLOGY OFFICE VISIT NOTE  History:   Crystal Brewer is a 46 y.o. [id number] here today for discussion regarding her fibroid.   Her last [place] on file wasin 2017 and shows a 1.3 cm anterior fibroid in the body of the uterus. ***   She denies any abnormal vaginal discharge, bleeding, pelvic pain or other concerns.     Past Medical History:  Diagnosis Date   Anemia    Asthma    Bleeding disorder ([place])    "UNKNOWN PROBLEM" has not seen "blood doctor "yet   Diabetes mellitus without complication ([place])    Gallstones    GERD (gastroesophageal reflux disease)    Headache(784.0)    Hx of degenerative disc disease    Hypertension    Obesity    Thyroid disease     Past Surgical History:  Procedure Laterality Date   CHOLECYSTECTOMY N/A 09/26/2013   Procedure: LAPAROSCOPIC CHOLECYSTECTOMY WITH INTRAOPERATIVE CHOLANGIOGRAM;  Surgeon: Earnstine Regal, MD;  Location: [facility];  Service: General;  Laterality: N/A;    The following portions of the patient's history were reviewed and updated as appropriate: allergies, current medications, past family history, past medical history, past social history, past surgical history and problem list.   Health Maintenance:   No pap on file   Normal mammogram on 2016 - none since, but order was placed by PCP.   Review of Systems:  Pertinent items noted in HPI and remainder of comprehensive ROS otherwise negative.  Physical Exam:  There were no vitals taken for this visit. CONSTITUTIONAL: Well-developed, well-nourished female in no acute distress.  HEENT:  Normocephalic, atraumatic. External right and left ear normal. No scleral icterus.  NECK: Normal range of motion, supple, no masses noted on observation SKIN: No rash noted. Not diaphoretic. No erythema. No pallor. MUSCULOSKELETAL: Normal range of motion. No edema noted. NEUROLOGIC: Alert and oriented to person, place, and time. Normal muscle tone coordination. No cranial nerve deficit  noted. PSYCHIATRIC: Normal mood and affect. Normal behavior. Normal judgment and thought content.  CARDIOVASCULAR: Normal heart rate noted RESPIRATORY: Effort and breath sounds normal, no problems with respiration noted ABDOMEN: No masses noted. No other overt distention noted.    PELVIC: Normal appearing external genitalia; normal urethral meatus; normal appearing vaginal mucosa and cervix.  No abnormal discharge noted.  Normal uterine size, no other palpable masses, no uterine or adnexal tenderness. Performed in the presence of a chaperone  Labs and Imaging No results found for this or any previous visit (from the past 168 hour(s)). No results found.  Assessment and Plan:  Diagnoses and all orders for this visit:  Intramural leiomyoma of uterus  Encounter for screening mammogram for malignant neoplasm of breast  - MXR already ordered - will aid pt in scheduling ***  Routine preventative health maintenance measures emphasized. Please refer to After Visit Summary for other counseling recommendations.   No follow-ups on file.  Radene Gunning, MD, [place] for [facility], [place]

## 2021-03-10 ENCOUNTER — Ambulatory Visit: Payer: Medicaid Other | Admitting: Obstetrics and Gynecology

## 2021-03-10 DIAGNOSIS — Z1231 Encounter for screening mammogram for malignant neoplasm of breast: Secondary | ICD-10-CM

## 2021-03-10 DIAGNOSIS — D251 Intramural leiomyoma of uterus: Secondary | ICD-10-CM

## 2021-04-09 NOTE — Therapy (Incomplete)
?OUTPATIENT PHYSICAL THERAPY LOWER EXTREMITY EVALUATION ? ? ?Patient Name: [facility] ?MRN: [id number] ?DOB:Apr 02, 1975, 46 y.o., female ?Today's Date: 04/09/2021 ? ? ? ?Past Medical History:  ?Diagnosis Date  ? Anemia   ? Asthma   ? Bleeding disorder ([place])   ? "UNKNOWN PROBLEM" has not seen "blood doctor "yet  ? Diabetes mellitus without complication ([place])   ? Gallstones   ? GERD (gastroesophageal reflux disease)   ? Headache(784.0)   ? Hx of degenerative disc disease   ? Hypertension   ? Obesity   ? Thyroid disease   ? ?Past Surgical History:  ?Procedure Laterality Date  ? CHOLECYSTECTOMY N/A 09/26/2013  ? Procedure: LAPAROSCOPIC CHOLECYSTECTOMY WITH INTRAOPERATIVE CHOLANGIOGRAM;  Surgeon: Earnstine Regal, MD;  Location: [facility];  Service: General;  Laterality: N/A;  ? ?Patient Active Problem List  ? Diagnosis Date Noted  ? Cholecystitis with cholelithiasis 09/26/2013  ? Cholelithiasis with cholecystitis 09/02/2013  ? ? ?PCP: Nolene Ebbs, MD ? ?REFERRING PROVIDER: Riki Sheer, NP ? ?REFERRING DIAG:  ?M25.562 (ICD-10-CM) - Pain in left knee  ?M25.561 (ICD-10-CM) - Pain in right knee  ? ? ?THERAPY DIAG:  ?No diagnosis found. ? ?ONSET DATE: *** ? ?SUBJECTIVE:  ? ?SUBJECTIVE STATEMENT: ?*** ? ?PERTINENT HISTORY: ?DM, HTN, obesity, chronic pain ? ?PAIN:  ?Are you having pain? No and Yes: {yespain:27235::"NPRS scale: ***/10","Pain location: ***","Pain description: ***","Aggravating factors: ***","Relieving factors: ***"} ? ?PRECAUTIONS: None ? ?WEIGHT BEARING RESTRICTIONS No ? ?FALLS:  ?Has patient fallen in last 6 months? {yes/no:20286}, Number of falls: *** ? ?LIVING ENVIRONMENT: ?Lives with: {[facility] lives with:25569::"lives with their family"} ?Lives in: {Lives in:25570} ?Stairs: {yes/no:20286}; {Stairs:24000} ?Has following equipment at home: {Assistive devices:23999} ? ?OCCUPATION: *** ? ?PLOF: Independent ? ?PATIENT GOALS reduce bil knee pain ? ? ?OBJECTIVE:  ? ?DIAGNOSTIC FINDINGS: x-ray in 2021: Right knee AP  lateral views: No acute fracture. Slight Medial  ?joint line narrowing . No bony abnormalities otherwise ? ?PATIENT SURVEYS:  ?FOTO *** ? ?COGNITION: ? Overall cognitive status: Within functional limits for tasks assessed   ?  ?SENSATION: ?{sensation:27233} ? ?MUSCLE LENGTH: ?Hamstrings: Right *** deg; Left *** deg ?Thomas test: Right *** deg; Left *** deg ? ?POSTURE:  ?*** ? ?PALPATION: ?*** ? ?LE ROM: ? ?{AROM/PROM:27142} ROM Right ?04/09/2021 Left ?04/09/2021  ?Hip flexion    ?Hip extension    ?Hip abduction    ?Hip adduction    ?Hip internal rotation    ?Hip external rotation    ?Knee flexion    ?Knee extension    ?Ankle dorsiflexion    ?Ankle plantarflexion    ?Ankle inversion    ?Ankle eversion    ? (Blank rows = not tested) ? ?LE MMT: ? ?MMT Right ?04/09/2021 Left ?04/09/2021  ?Hip flexion    ?Hip extension    ?Hip abduction    ?Hip adduction    ?Hip internal rotation    ?Hip external rotation    ?Knee flexion    ?Knee extension    ?Ankle dorsiflexion    ?Ankle plantarflexion    ?Ankle inversion    ?Ankle eversion    ? (Blank rows = not tested) ? ?LOWER EXTREMITY SPECIAL TESTS:  ?{LEspecialtests:26242} ? ?FUNCTIONAL TESTS:  ?{Functional tests:24029} ? ?GAIT: ?Distance walked: *** ?Assistive device utilized: {Assistive devices:23999} ?Level of assistance: {Levels of assistance:24026} ?Comments: *** ? ? ? ?TODAY'S TREATMENT: ?HEP established: see below.  ?If treatment provided at initial evaluation, no treatment charged due to lack of authorization.    ?  ? ? ?  PATIENT EDUCATION:  ?Education details: *** ?Person educated: Patient ?Education method: Explanation, Demonstration, and Handouts ?Education comprehension: verbalized understanding and returned demonstration ? ? ?HOME EXERCISE PROGRAM: ?*** ? ?ASSESSMENT: ? ?CLINICAL IMPRESSION: ?Patient is a 46 y.o. female  who was seen today for physical therapy evaluation and treatment for bil knee pain. Patient will benefit from skilled PT to address the below impairments  and improve overall function. .  ? ? ?OBJECTIVE IMPAIRMENTS {opptimpairments:25111}.  ? ?ACTIVITY LIMITATIONS {activity limitations:25113}.  ? ?PERSONAL FACTORS {Personal factors:25162} are also affecting patient's functional outcome.  ? ? ?REHAB POTENTIAL: {rehabpotential:25112} ? ?CLINICAL DECISION MAKING: {clinical decision making:25114} ? ?EVALUATION COMPLEXITY: {Evaluation complexity:25115} ? ? ?GOALS: ?Goals reviewed with patient? Yes ? ?SHORT TERM GOALS: Target date:  05/09/21 ? ?Be independent in initial HEP ?Baseline: no current HEP ?Goal status: INITIAL ? ?2.  *** ?Baseline: *** ?Goal status: {GOALSTATUS:25110} ? ?3.  *** ?Baseline: *** ?Goal status: {GOALSTATUS:25110} ? ?4.  *** ?Baseline: *** ?Goal status: {GOALSTATUS:25110} ? ?LONG TERM GOALS: Target date:  06/01/21 ? ?Be independent in advanced HEP ?Baseline: *** ?Goal status: INITIAL ? ?2.  Improve FOTO to > or = to  ?Baseline: *** ?Goal status: INITIAL ? ?3.  *** ?Baseline: *** ?Goal status: {GOALSTATUS:25110} ? ?4.  *** ?Baseline: *** ?Goal status: {GOALSTATUS:25110} ? ?5.  *** ?Baseline: *** ?Goal status: INITIAL ? ?6.  *** ?Baseline: *** ?Goal status: {GOALSTATUS:25110} ? ? ?PLAN: ?PT FREQUENCY: 2x/week ? ?PT DURATION: 8 weeks ? ?PLANNED INTERVENTIONS: Therapeutic exercises, Therapeutic activity, Neuromuscular re-education, Balance training, Gait training, Patient/Family education, Joint mobilization, Stair training, Aquatic Therapy, Dry Needling, Electrical stimulation, Cryotherapy, Moist heat, Taping, Vasopneumatic device, Ultrasound, Ionotophoresis '4mg'$ /ml Dexamethasone, and Manual therapy ? ?PLAN FOR NEXT SESSION: review HEP ? ? ?Jaloni Davoli, PT ?04/09/2021, 10:43 AM  ?

## 2021-04-11 ENCOUNTER — Ambulatory Visit: Payer: Medicaid Other

## 2021-04-12 ENCOUNTER — Ambulatory Visit: Payer: Medicaid Other | Admitting: Rehabilitative and Restorative Service Providers"

## 2021-04-19 ENCOUNTER — Ambulatory Visit: Payer: Medicaid Other | Admitting: Obstetrics

## 2021-04-19 ENCOUNTER — Ambulatory Visit: Payer: Medicaid Other | Admitting: Rehabilitative and Restorative Service Providers"

## 2021-04-20 ENCOUNTER — Ambulatory Visit: Payer: Medicaid Other | Attending: Nurse Practitioner | Admitting: Rehabilitative and Restorative Service Providers"

## 2021-05-18 ENCOUNTER — Other Ambulatory Visit (HOSPITAL_COMMUNITY)
Admission: RE | Admit: 2021-05-18 | Discharge: 2021-05-18 | Disposition: A | Discharge status: Home / Self Care | Payer: Medicaid Other | Source: Ambulatory Visit | Attending: Obstetrics and Gynecology | Admitting: Obstetrics and Gynecology

## 2021-05-18 ENCOUNTER — Ambulatory Visit (INDEPENDENT_AMBULATORY_CARE_PROVIDER_SITE_OTHER): Payer: Medicaid Other | Admitting: Obstetrics and Gynecology

## 2021-05-18 ENCOUNTER — Encounter: Payer: Self-pay | Admitting: Obstetrics and Gynecology

## 2021-05-18 DIAGNOSIS — Z202 Contact with and (suspected) exposure to infections with a predominantly sexual mode of transmission: Secondary | ICD-10-CM | POA: Diagnosis present

## 2021-05-18 DIAGNOSIS — E049 Nontoxic goiter, unspecified: Secondary | ICD-10-CM | POA: Diagnosis not present

## 2021-05-18 DIAGNOSIS — D219 Benign neoplasm of connective and other soft tissue, unspecified: Secondary | ICD-10-CM

## 2021-05-18 DIAGNOSIS — Z01419 Encounter for gynecological examination (general) (routine) without abnormal findings: Secondary | ICD-10-CM

## 2021-05-18 NOTE — Patient Instructions (Signed)

## 2021-05-18 NOTE — Progress Notes (Signed)
Crystal Brewer is a 46 y.o. [phone] female here for a routine annual gynecologic exam.  Current complaints: HMC, uterine fibroids and desire for STD testing. For the last yr cycles have been irregular and heavy with cramps. H/O uterine fibroids.   .  ?  ?Gynecologic History ?No LMP recorded. (Menstrual status: Irregular Periods). ?Contraception: condoms ?Last Pap: 1-2 yrs ago. Results were: abnormal ?Last mammogram: Never had.  ? ?Obstetric History ?OB History  ?Gravida Para Term Preterm AB Living  ?'10 7 7 '$ 0 3 7  ?SAB IAB Ectopic Multiple Live Births  ?3 0 0 0 7  ?  ?# Outcome Date GA Lbr Len/2nd Weight Sex Delivery Anes PTL Lv  ?10 SAB           ?9 SAB           ?8 SAB           ?7 Term           ?6 Term           ?5 Term           ?4 Term           ?3 Term           ?2 Term           ?1 Term           ? ? ?Past Medical History:  ?Diagnosis Date  ? Anemia   ? Asthma   ? Bleeding disorder ([place])   ? "UNKNOWN PROBLEM" has not seen "blood doctor "yet  ? Cholecystitis with cholelithiasis 09/26/2013  ? Diabetes mellitus without complication ([place])   ? Gallstones   ? GERD (gastroesophageal reflux disease)   ? Headache(784.0)   ? Hx of degenerative disc disease   ? Hypertension   ? Obesity   ? Thyroid disease   ? ? ?Past Surgical History:  ?Procedure Laterality Date  ? CHOLECYSTECTOMY N/A 09/26/2013  ? Procedure: LAPAROSCOPIC CHOLECYSTECTOMY WITH INTRAOPERATIVE CHOLANGIOGRAM;  Surgeon: Earnstine Regal, MD;  Location: [facility];  Service: General;  Laterality: N/A;  ? ? ?Current Outpatient Medications on File Prior to Visit  ?Medication Sig Dispense Refill  ? albuterol (PROVENTIL HFA;VENTOLIN HFA) 108 (90 BASE) MCG/ACT inhaler Inhale 2 puffs into the lungs every 4 (four) hours as needed for wheezing or shortness of breath.    ? canagliflozin (INVOKANA) 100 MG TABS tablet Take 100 mg by mouth daily.    ? diclofenac (VOLTAREN) 75 MG EC tablet Take 75 mg by mouth 2 (two) times daily.    ? lisinopril-hydrochlorothiazide  (PRINZIDE,ZESTORETIC) 20-12.5 MG per tablet Take 1 tablet by mouth every morning.     ? meloxicam (MOBIC) 15 MG tablet Take 1 tablet (15 mg total) by mouth daily. 14 tablet 0  ? metFORMIN (GLUCOPHAGE) 500 MG tablet Take 500 mg by mouth 2 (two) times daily with a meal.    ? naproxen (NAPROSYN) 500 MG tablet Take 1 tablet (500 mg total) by mouth 2 (two) times daily with a meal. 30 tablet 0  ? omeprazole (PRILOSEC) 20 MG capsule Take 40 mg by mouth daily.    ? OXYCODONE ER PO Take by mouth.    ? gabapentin (NEURONTIN) 300 MG capsule TAKE 1 (ONE) CAPSULE BY MOUTH THREE TIMES DAILY 90 capsule 0  ? ?No current facility-administered medications on file prior to visit.  ? ? ?No Known Allergies ? ?Social History  ? ?Socioeconomic History  ? Marital status: Married  ?  Spouse name: Not on file  ? Number of children: Not on file  ? Years of education: Not on file  ? Highest education level: Not on file  ?Occupational History  ? Not on file  ?Tobacco Use  ? Smoking status: Every Day  ?  Types: Cigarettes  ? Smokeless tobacco: Never  ?Vaping Use  ? Vaping Use: Every day  ? Substances: Nicotine, Flavoring  ?Substance and Sexual Activity  ? Alcohol use: Yes  ?  Comment: occ  ? Drug use: No  ? Sexual activity: Not on file  ?Other Topics Concern  ? Not on file  ?Social History Narrative  ? Not on file  ? ?Social Determinants of Health  ? ?Financial Resource Strain: Not on file  ?Food Insecurity: Not on file  ?Transportation Needs: Not on file  ?Physical Activity: Not on file  ?Stress: Not on file  ?Social Connections: Not on file  ?Intimate Partner Violence: Not on file  ? ? ?Family History  ?Problem Relation Age of Onset  ? Cancer Mother   ?     Pancreatic Cancer  ? ? ?The following portions of the patient's history were reviewed and updated as appropriate: allergies, current medications, past family history, past medical history, past social history, past surgical history and problem list. ? ?Review of Systems ?Pertinent items  noted in HPI and remainder of comprehensive ROS otherwise negative. ?  ?Objective:  ?BP 118/84   Pulse 80   Ht '5\' 2"'$  (1.575 m)   Wt 215 lb 8 oz (97.8 kg)   BMI 39.42 kg/m?  ?CONSTITUTIONAL: Well-developed, well-nourished female in no acute distress.  ?HENT:  Normocephalic, atraumatic, External right and left ear normal. Oropharynx is clear and moist ?EYES: Conjunctivae and EOM are normal. Pupils are equal, round, and reactive to light. No scleral icterus.  ?NECK: Normal range of motion, supple, no masses.  Enlarged thyroid.  ?SKIN: Skin is warm and dry. No rash noted. Not diaphoretic. No erythema. No pallor. ?[place]: Alert and oriented to person, place, and time. Normal reflexes, muscle tone coordination. No cranial nerve deficit noted. ?PSYCHIATRIC: Normal mood and affect. Normal behavior. Normal judgment and thought content. ?CARDIOVASCULAR: Normal heart rate noted, regular rhythm ?RESPIRATORY: Clear to auscultation bilaterally. Effort and breath sounds normal, no problems with respiration noted. ?BREASTS: Symmetric in size. No masses, skin changes, nipple drainage, or lymphadenopathy. ?ABDOMEN: Soft, normal bowel sounds, no distention noted.  No tenderness, rebound or guarding.  ?PELVIC: Normal appearing external genitalia; normal appearing vaginal mucosa and cervix.  No abnormal discharge noted.  Pap smear obtained.  Normal uterine size, no other palpable masses, no uterine or adnexal tenderness. ?MUSCULOSKELETAL: Normal range of motion. No tenderness.  No cyanosis, clubbing, or edema.  2+ distal pulses. ? ? ?Assessment:  ?Annual gynecologic examination with pap smear ?Uterine fibroids ?Enlarged Thyroid ?STD testing ?Plan:  ?Will follow up results of pap smear and manage accordingly. ?Mammogram scheduled. STD testing as per pt request. Thyroid U/S. GYN U/S ?Routine preventative health maintenance measures emphasized. ?Please refer to After Visit Summary for other counseling recommendations.  ?F/U in 4  weeks to discuss test results. Pt is interested in hysterectomy. ?To see PCP for management of chronic medical problems ? ?Chancy Milroy, MD, FACOG ?Attending [place] for [employer], [place]  ?

## 2021-05-18 NOTE — Progress Notes (Signed)
Pt moved here from [facility], states pap smear was completed either Aug or Sept of 2022. Pt states the pap smear was abnormal but is unsure why.  ? ?Pt c/o vaginal itching and requesting STI testing.  ?

## 2021-05-19 LAB — CERVICOVAGINAL ANCILLARY ONLY
Bacterial Vaginitis (gardnerella): NEGATIVE
Candida Glabrata: POSITIVE — AB
Candida Vaginitis: NEGATIVE
Chlamydia: NEGATIVE
Comment: NEGATIVE
Comment: NEGATIVE
Comment: NEGATIVE
Comment: NEGATIVE
Comment: NEGATIVE
Comment: NORMAL
Neisseria Gonorrhea: NEGATIVE
Trichomonas: NEGATIVE

## 2021-05-19 LAB — CYTOLOGY - PAP
Comment: NEGATIVE
Diagnosis: NEGATIVE
High risk HPV: NEGATIVE

## 2021-05-19 LAB — HEPATITIS B SURFACE ANTIGEN: Hepatitis B Surface Ag: NEGATIVE

## 2021-05-19 LAB — HEPATITIS C ANTIBODY: Hep C Virus Ab: NONREACTIVE

## 2021-05-19 LAB — RPR: RPR Ser Ql: NONREACTIVE

## 2021-05-19 LAB — HIV ANTIBODY (ROUTINE TESTING W REFLEX): HIV Screen 4th Generation wRfx: NONREACTIVE

## 2021-05-20 ENCOUNTER — Encounter: Payer: Self-pay | Admitting: Obstetrics and Gynecology

## 2021-05-20 ENCOUNTER — Other Ambulatory Visit: Payer: Self-pay | Admitting: Emergency Medicine

## 2021-05-20 ENCOUNTER — Ambulatory Visit (HOSPITAL_COMMUNITY)
Admission: RE | Admit: 2021-05-20 | Discharge: 2021-05-20 | Disposition: A | Discharge status: Home / Self Care | Payer: Medicaid Other | Source: Ambulatory Visit | Attending: Obstetrics and Gynecology | Admitting: Obstetrics and Gynecology

## 2021-05-20 DIAGNOSIS — E049 Nontoxic goiter, unspecified: Secondary | ICD-10-CM | POA: Diagnosis present

## 2021-05-21 ENCOUNTER — Encounter: Payer: Self-pay | Admitting: Obstetrics and Gynecology

## 2021-05-23 ENCOUNTER — Other Ambulatory Visit: Payer: Self-pay | Admitting: Obstetrics and Gynecology

## 2021-05-23 ENCOUNTER — Encounter: Payer: Self-pay | Admitting: Obstetrics and Gynecology

## 2021-05-23 DIAGNOSIS — E049 Nontoxic goiter, unspecified: Secondary | ICD-10-CM

## 2021-05-24 ENCOUNTER — Telehealth: Payer: Self-pay

## 2021-05-24 MED ORDER — FLUCONAZOLE 150 MG PO TABS: 150.0000 mg | ORAL_TABLET | Freq: Once | ORAL | 0 refills | Status: AC

## 2021-05-24 NOTE — Telephone Encounter (Signed)
S/w pt and advised of results and rx sent. 

## 2021-05-25 ENCOUNTER — Ambulatory Visit: Payer: Medicaid Other

## 2021-05-31 ENCOUNTER — Ambulatory Visit: Admission: RE | Admit: 2021-05-31 | Payer: Medicaid Other | Source: Ambulatory Visit

## 2021-06-01 ENCOUNTER — Ambulatory Visit
Admission: RE | Admit: 2021-06-01 | Discharge: 2021-06-01 | Disposition: A | Discharge status: Home / Self Care | Payer: Medicaid Other | Source: Ambulatory Visit | Attending: Obstetrics and Gynecology | Admitting: Obstetrics and Gynecology

## 2021-06-01 DIAGNOSIS — D219 Benign neoplasm of connective and other soft tissue, unspecified: Secondary | ICD-10-CM | POA: Insufficient documentation

## 2021-06-02 ENCOUNTER — Other Ambulatory Visit: Payer: Medicaid Other

## 2021-06-09 ENCOUNTER — Ambulatory Visit
Admission: RE | Admit: 2021-06-09 | Discharge: 2021-06-09 | Disposition: A | Discharge status: Home / Self Care | Payer: Medicaid Other | Source: Ambulatory Visit | Attending: Obstetrics and Gynecology | Admitting: Obstetrics and Gynecology

## 2021-06-09 ENCOUNTER — Other Ambulatory Visit (HOSPITAL_COMMUNITY)
Admission: RE | Admit: 2021-06-09 | Discharge: 2021-06-09 | Disposition: A | Discharge status: Home / Self Care | Payer: Medicaid Other | Source: Ambulatory Visit | Attending: Radiology | Admitting: Radiology

## 2021-06-09 DIAGNOSIS — E049 Nontoxic goiter, unspecified: Secondary | ICD-10-CM | POA: Insufficient documentation

## 2021-06-13 LAB — CYTOLOGY - NON PAP

## 2021-06-15 ENCOUNTER — Ambulatory Visit (INDEPENDENT_AMBULATORY_CARE_PROVIDER_SITE_OTHER): Payer: Medicaid Other | Admitting: Obstetrics & Gynecology

## 2021-06-15 ENCOUNTER — Encounter: Payer: Self-pay | Admitting: Obstetrics & Gynecology

## 2021-06-15 VITALS — BP 114/79 | HR 83 | Wt 217.0 lb

## 2021-06-15 DIAGNOSIS — N939 Abnormal uterine and vaginal bleeding, unspecified: Secondary | ICD-10-CM

## 2021-06-15 DIAGNOSIS — E049 Nontoxic goiter, unspecified: Secondary | ICD-10-CM | POA: Diagnosis not present

## 2021-06-15 MED ORDER — NAPROXEN 500 MG PO TABS: 500.0000 mg | ORAL_TABLET | Freq: Two times a day (BID) | ORAL | 2 refills | Status: AC

## 2021-06-15 NOTE — Progress Notes (Signed)
? ?GYNECOLOGY OFFICE VISIT NOTE ? ?History:  ? Crystal Brewer is a 46 y.o. [id number] (SVD x 7) here today for discussion of results of evaluation for AUB.  Reports having heavy periods, but this does not happen monthly.  Has been skipping months, but when the period comes, it is very heavy and lasts 7-10 days.  Some months, like this month, it is lighter. She really wants a hysterectomy.  Also wants to discuss results of thyroid studies for her enlarged thyroid, had normal TSH in 12/2020. She is currently at end of her period and spotting, she denies any abnormal vaginal discharge, pelvic pain, lightheadedness, dizziness or other concerns.  ?  ?Past Medical History:  ?Diagnosis Date  ? Anemia   ? Asthma   ? Bleeding disorder ([place])   ? "UNKNOWN PROBLEM" has not seen "blood doctor "yet  ? Cholecystitis with cholelithiasis 09/26/2013  ? Diabetes mellitus without complication ([place])   ? Gallstones   ? GERD (gastroesophageal reflux disease)   ? Headache(784.0)   ? Hx of degenerative disc disease   ? Hypertension   ? Obesity   ? Thyroid disease   ? ? ?Past Surgical History:  ?Procedure Laterality Date  ? CHOLECYSTECTOMY N/A 09/26/2013  ? Procedure: LAPAROSCOPIC CHOLECYSTECTOMY WITH INTRAOPERATIVE CHOLANGIOGRAM;  Surgeon: Earnstine Regal, MD;  Location: [facility];  Service: General;  Laterality: N/A;  ? ? ?The following portions of the patient's history were reviewed and updated as appropriate: allergies, current medications, past family history, past medical history, past social history, past surgical history and problem list.  ? ?Health Maintenance:  Normal pap and negative HRHPV on 05/18/2021.  Mammogram scheduled on 09/21/2021.  ? ?Review of Systems:  ?Pertinent items noted in HPI and remainder of comprehensive ROS otherwise negative. ? ?Physical Exam:  ?BP 114/79   Pulse 83   Wt 217 lb (98.4 kg)   LMP 06/08/2021   BMI 39.69 kg/m?  ?CONSTITUTIONAL: Well-developed, well-nourished female in no acute distress.  ?HEENT:   Normocephalic, atraumatic. External right and left ear normal. No scleral icterus.  ?NECK: Normal range of motion, supple, no masses noted on observation ?SKIN: No rash noted. Not diaphoretic. No erythema. No pallor. ?MUSCULOSKELETAL: Normal range of motion. No edema noted. ?NEUROLOGIC: Alert and oriented to person, place, and time. Normal muscle tone coordination. No cranial nerve deficit noted. ?PSYCHIATRIC: Normal mood and affect. Normal behavior. Normal judgment and thought content. ?CARDIOVASCULAR: Normal heart rate noted ?RESPIRATORY: Effort and breath sounds normal, no problems with respiration noted ?ABDOMEN: No masses noted. No other overt distention noted.   ?PELVIC: Deferred ? ?Labs and Imaging ?Results for orders placed or performed during the hospital encounter of 06/09/21 (from the past 168 hour(s))  ?Cytology - Non PAP; THYROID ISTHMUS  ? Collection Time: 06/09/21  3:45 PM  ?Result Value Ref Range  ? CYTOLOGY - NON GYN    ?  CYTOLOGY - NON PAP ?CASE: [id number] ?PATIENT: Crystal Brewer ?Non-Gynecological Cytology Report ? ? ? ? ?Clinical History: Nodule #1: Isthmus; Upper, Maximum size: 1.8 cm; Other ?2 dimensions: 1.2 cm x 1.5 cm, solid/almost completely solid, isoechoic, ?TI-RADS total points: 5. ?Specimen Submitted:  A. THYROID, UPPER ISTHMUS, FINE NEEDLE ASPIRATION: ? ? ?FINAL MICROSCOPIC DIAGNOSIS: ?- Consistent with benign follicular nodule (Bethesda category II) ? ?SPECIMEN ADEQUACY: ?Satisfactory for evaluation ? ?GROSS: ?Received is/are 30 cc's of pale pink cytolyt solution and 6 slides in ?95% ethyl alcohol. (EMH:emh) ?Prepared: ?Smears:  6 ?Concentration Method (Thin Prep):  1 ?Cell Block:  Cell  block attempted, not obtained. ?Additional Studies: Afirma collected. ? ? ? ? ?Final Diagnosis performed by Jaquita Folds, MD.   Electronically ?signed 06/13/2021 ?Technical component performed at [employer]. [facility], [address] [place], [place] [address]. ? Professional component  perfo rmed at [facility], ?[place] [address], [place], [place] [address]. ? Immunohistochemistry Technical component (if applicable) was performed ?at [facility]. [place], [address], ?[place], [place] [address].   IMMUNOHISTOCHEMISTRY DISCLAIMER (if applicable): ?Some of these immunohistochemical stains may have been developed and the ?performance characteristics determine by [facility]. Some ?may not have been cleared or approved by the U.S. Food and Drug ?Administration. The FDA has determined that such clearance or approval ?is not necessary. This test is used for clinical purposes. It should not ?be regarded as investigational or for research. This laboratory is ?certified under the Clinical Laboratory Improvement Amendments of 1988 ?(CLIA-88) as qualified to perform high complexity clinical laboratory ?testing.  The controls stained appropriately. ?  ? ?[place] FNA BX THYROID 1ST LESION AFIRMA ? ?Result Date: 06/09/2021 ?INDICATION: Isthmus nodule 1.8 cm EXAM: ULTRASOUND GUIDED FINE NEEDLE ASPIRATION OF INDETERMINATE THYROID NODULE COMPARISON:  US Thyroid 05/20/21. MEDICATIONS: 10 cc 1% lidocaine COMPLICATIONS: None immediate. TECHNIQUE: Informed written consent was obtained from the patient after a discussion of the risks, benefits and alternatives to treatment. Questions regarding the procedure were encouraged and answered. A timeout was performed prior to the initiation of the procedure. Pre-procedural ultrasound scanning demonstrated unchanged size and appearance of the indeterminate nodule within the Isthmus The procedure was planned. The neck was prepped in the usual sterile fashion, and a sterile drape was applied covering the operative field. A timeout was performed prior to the initiation of the procedure. Local anesthesia was provided with 1% lidocaine. Under direct ultrasound guidance, 5 FNA biopsies were performed of the isthmus nodule with a 27  gauge needle. 2 of these samples were obtained for [facility] Multiple ultrasound images were saved for procedural documentation purposes. The samples were prepared and submitted to pathology. Limited post procedural scanning was negative for hematoma or additional complication. Dressings were placed. The patient tolerated the above procedures procedure well without immediate postprocedural complication. FINDINGS: Nodule reference number based on prior diagnostic ultrasound: 1 Maximum size: 1.8 cm Location: Isthmus; Mid ACR TI-RADS risk category: TR4 (4-6 points) Reason for biopsy: meets ACR TI-RADS criteria Ultrasound imaging confirms appropriate placement of the needles within the thyroid nodule. IMPRESSION: Technically successful ultrasound guided fine needle aspiration of isthmus nodule. Read by [name] [facility] Electronically Signed   By: Markus Daft M.D.   On: 06/09/2021 15:50  ? ?US THYROID ? ?Result Date: 05/21/2021 ?CLINICAL DATA:  46 year old female with a history of enlarged thyroid EXAM: THYROID ULTRASOUND TECHNIQUE: Ultrasound examination of the thyroid gland and adjacent soft tissues was performed. COMPARISON:  None. FINDINGS: Parenchymal Echotexture: Mildly heterogenous Isthmus: 0.9 cm Right lobe: 5.9 cm x 2.5 cm x 3.2 cm Left lobe: 5.1 cm x 2.3 cm x 2.6 cm _________________________________________________________ Estimated total number of nodules >/= 1 cm: 1 Number of spongiform nodules >/=  2 cm not described below (TR1): 0 Number of mixed cystic and solid nodules >/= 1.5 cm not described below (TR2): 0 _________________________________________________________ Nodule # 1: Location: Isthmus; upper Maximum size: 1.8 cm; Other 2 dimensions: 1.2 cm x 1.5 cm Composition: solid/almost completely solid (2) Echogenicity: isoechoic (1) Shape: not taller-than-wide (0) Margins: lobulated/irregular (2) Echogenic foci: none (0) ACR TI-RADS total  points: 5. ACR TI-RADS risk category: TR4 (4-6 points). ACR TI-RADS  recommendations: Nodule meets criteria for biopsy _________________________________________________________ Nodule within the left thyroid, 8 mm, spongiform and does not meet criteria for surveillance. No adenopathy IMP

## 2021-06-15 NOTE — Patient Instructions (Signed)
Take Naproxen one hour before procedure ?

## 2021-07-08 ENCOUNTER — Ambulatory Visit: Payer: Medicaid Other | Admitting: Obstetrics and Gynecology

## 2021-07-25 ENCOUNTER — Encounter: Payer: Self-pay | Admitting: Obstetrics and Gynecology

## 2021-07-25 ENCOUNTER — Ambulatory Visit (INDEPENDENT_AMBULATORY_CARE_PROVIDER_SITE_OTHER): Payer: Medicaid Other | Admitting: Obstetrics and Gynecology

## 2021-07-25 VITALS — BP 114/80 | HR 80 | Wt 214.0 lb

## 2021-07-25 DIAGNOSIS — D259 Leiomyoma of uterus, unspecified: Secondary | ICD-10-CM | POA: Diagnosis not present

## 2021-07-25 DIAGNOSIS — D219 Benign neoplasm of connective and other soft tissue, unspecified: Secondary | ICD-10-CM

## 2021-07-25 MED ORDER — MEGESTROL ACETATE 40 MG PO TABS: 40.0000 mg | ORAL_TABLET | Freq: Every day | ORAL | 5 refills | Status: DC

## 2021-07-25 NOTE — Progress Notes (Signed)
Pt here for follow up appt. Continues to have problems with [facility] with uterine fibroids.  GYN U/S uterine fibroids, vol 264 gms. TSVD x 7 ( largest 8# 6 oz)  PE AF VSS Lungs clear Heart RRR Abd soft + BS GU Nl EGBUS, uterus < 10 week size, mobile, slightly tender  A/P [facility]        Uterine fibroids  Pt desires definite therapy. TVH with possible salpingectomy reviewed with pt. R/B/Post op care reviewed. Information provided to pt. Hysterectomy papers signed today Will start Megace qd til surgery F/U with post op appt.

## 2021-09-07 ENCOUNTER — Other Ambulatory Visit: Payer: Self-pay | Admitting: Obstetrics and Gynecology

## 2021-09-07 DIAGNOSIS — D219 Benign neoplasm of connective and other soft tissue, unspecified: Secondary | ICD-10-CM

## 2021-09-14 ENCOUNTER — Other Ambulatory Visit: Payer: Self-pay | Admitting: Obstetrics and Gynecology

## 2021-09-14 DIAGNOSIS — D219 Benign neoplasm of connective and other soft tissue, unspecified: Secondary | ICD-10-CM

## 2021-09-21 ENCOUNTER — Ambulatory Visit: Payer: Medicaid Other

## 2021-09-22 ENCOUNTER — Ambulatory Visit
Admission: RE | Admit: 2021-09-22 | Discharge: 2021-09-22 | Disposition: A | Discharge status: Home / Self Care | Payer: Medicaid Other | Source: Ambulatory Visit | Attending: Obstetrics and Gynecology | Admitting: Obstetrics and Gynecology

## 2021-09-22 DIAGNOSIS — Z01419 Encounter for gynecological examination (general) (routine) without abnormal findings: Secondary | ICD-10-CM

## 2021-09-26 NOTE — Pre-Procedure Instructions (Signed)
Surgical Instructions    Your procedure is scheduled on Wednesday, September 6th.  Report to [facility] Main Entrance "A" at 10:30 A.M., then check in with the Admitting office.  Call this number if you have problems the morning of surgery:  [phone]   If you have any questions prior to your surgery date call [phone]: Open Monday-Friday 8am-4pm    Remember:  Do not eat after midnight the night before your surgery  You may drink clear liquids until 09:30 AM the morning of your surgery.   Clear liquids allowed are: Water, Non-Citrus Juices (without pulp), Carbonated Beverages, Clear Tea, Black Coffee Only (NO MILK, CREAM OR POWDERED CREAMER of any kind), and Gatorade.    Take these medicines the morning of surgery with A SIP OF WATER  cetirizine (ZYRTEC) fluconazole (DIFLUCAN)  gabapentin (NEURONTIN) megestrol (MEGACE)   If needed: albuterol (PROVENTIL HFA;VENTOLIN HFA)- if needed, bring with you on day of surgery fluticasone (FLONASE)  oxyCODONE-acetaminophen (PERCOCET) pantoprazole (PROTONIX)  sucralfate (CARAFATE)   WHAT DO I DO ABOUT MY DIABETES MEDICATION?   Do not take JARDIANCE for 72 hours prior to surgery. Do not take metFORMIN (GLUCOPHAGE) on the day of surgery.  Do not take TRULICITY for 1 week prior to surgery.     HOW TO MANAGE YOUR DIABETES BEFORE AND AFTER SURGERY  Why is it important to control my blood sugar before and after surgery? Improving blood sugar levels before and after surgery helps healing and can limit problems. A way of improving blood sugar control is eating a healthy diet by:  Eating less sugar and carbohydrates  Increasing activity/exercise  Talking with your doctor about reaching your blood sugar goals High blood sugars (greater than 180 mg/dL) can raise your risk of infections and slow your recovery, so you will need to focus on controlling your diabetes during the weeks before surgery. Make sure that the doctor who takes  care of your diabetes knows about your planned surgery including the date and location.  How do I manage my blood sugar before surgery? Check your blood sugar at least 4 times a day, starting 2 days before surgery, to make sure that the level is not too high or low.  Check your blood sugar the morning of your surgery when you wake up and every 2 hours until you get to the Short Stay unit.  If your blood sugar is less than 70 mg/dL, you will need to treat for low blood sugar: Do not take insulin. Treat a low blood sugar (less than 70 mg/dL) with  cup of clear juice (cranberry or apple), 4 glucose tablets, OR glucose gel. Recheck blood sugar in 15 minutes after treatment (to make sure it is greater than 70 mg/dL). If your blood sugar is not greater than 70 mg/dL on recheck, call [phone] for further instructions. Report your blood sugar to the short stay nurse when you get to Short Stay.  If you are admitted to the hospital after surgery: Your blood sugar will be checked by the staff and you will probably be given insulin after surgery (instead of oral diabetes medicines) to make sure you have good blood sugar levels. The goal for blood sugar control after surgery is 80-180 mg/dL.   As of today, STOP taking any Aspirin (unless otherwise instructed by your surgeon) Aleve, Naproxen, Ibuprofen, Motrin, Advil, Goody's, BC's, all herbal medications, fish oil, and all vitamins.  Do NOT Smoke (Tobacco/Vaping) for 24 hours prior to your procedure.  If you use a CPAP at night, you may bring your mask/headgear for your overnight stay.   Contacts, glasses, piercing's, hearing aid's, dentures or partials may not be worn into surgery, please bring cases for these belongings.    For patients admitted to the hospital, discharge time will be determined by your treatment team.   Patients discharged the day of surgery will not be allowed to drive home, and someone needs to stay with  them for 24 hours.  SURGICAL WAITING ROOM VISITATION Patients having surgery or a procedure may have no more than 2 support people in the waiting area - these visitors may rotate.   Children under the age of 92 must have an adult with them who is not the patient. If the patient needs to stay at the hospital during part of their recovery, the visitor guidelines for inpatient rooms apply. Pre-op nurse will coordinate an appropriate time for 1 support person to accompany patient in pre-op.  This support person may not rotate.   Please refer to the [facility] website for the visitor guidelines for Inpatients (after your surgery is over and you are in a regular room).    Special instructions:   [place]- Preparing For Surgery  Before surgery, you can play an important role. Because skin is not sterile, your skin needs to be as free of germs as possible. You can reduce the number of germs on your skin by washing with CHG (chlorahexidine gluconate) Soap before surgery.  CHG is an antiseptic cleaner which kills germs and bonds with the skin to continue killing germs even after washing.    Oral Hygiene is also important to reduce your risk of infection.  Remember - BRUSH YOUR TEETH THE MORNING OF SURGERY WITH YOUR REGULAR TOOTHPASTE  Please do not use if you have an allergy to CHG or antibacterial soaps. If your skin becomes reddened/irritated stop using the CHG.  Do not shave (including legs and underarms) for at least 48 hours prior to first CHG shower. It is OK to shave your face.  Please follow these instructions carefully.   Shower the NIGHT BEFORE SURGERY and the MORNING OF SURGERY  If you chose to wash your hair, wash your hair first as usual with your normal shampoo.  After you shampoo, rinse your hair and body thoroughly to remove the shampoo.  Use CHG Soap as you would any other liquid soap. You can apply CHG directly to the skin and wash gently with a scrungie or a clean washcloth.    Apply the CHG Soap to your body ONLY FROM THE NECK DOWN.  Do not use on open wounds or open sores. Avoid contact with your eyes, ears, mouth and genitals (private parts). Wash Face and genitals (private parts)  with your normal soap.   Wash thoroughly, paying special attention to the area where your surgery will be performed.  Thoroughly rinse your body with warm water from the neck down.  DO NOT shower/wash with your normal soap after using and rinsing off the CHG Soap.  Pat yourself dry with a CLEAN TOWEL.  Wear CLEAN PAJAMAS to bed the night before surgery  Place CLEAN SHEETS on your bed the night before your surgery  DO NOT SLEEP WITH PETS.   Day of Surgery: Take a shower with CHG soap. Do not wear jewelry or makeup Do not wear lotions, powders, perfumes, or deodorant. Do not shave 48  hours prior to surgery.   Do not bring valuables to the hospital.  [facility] is not responsible for any belongings or valuables. Do not wear nail polish, gel polish, artificial nails, or any other type of covering on natural nails (fingers and toes) If you have artificial nails or gel coating that need to be removed by a nail salon, please have this removed prior to surgery. Artificial nails or gel coating may interfere with anesthesia's ability to adequately monitor your vital signs. Wear Clean/Comfortable clothing the morning of surgery Remember to brush your teeth WITH YOUR REGULAR TOOTHPASTE.   Please read over the following fact sheets that you were given.    If you received a COVID test during your pre-op visit  it is requested that you wear a mask when out in public, stay away from anyone that may not be feeling well and notify your surgeon if you develop symptoms. If you have been in contact with anyone that has tested positive in the last 10 days please notify you surgeon.

## 2021-09-26 NOTE — H&P (Signed)
Crystal Brewer is an 46 y.o. female G10 [phone] with heavy menstrual cycles and uterine fibroids. GYN U/S, vol 264 gms and uterine fibroid. Pt has tried medications for her cycles without relief. She desires definite therapy.  TSVD x 7 ( largest 8# 6 oz) SAB x 3  Pap smear UTD  Menstrual History: Menarche age: 5 No LMP recorded. (Menstrual status: Irregular Periods).    Past Medical History:  Diagnosis Date   Anemia    Asthma    Bleeding disorder ([place])    "UNKNOWN PROBLEM" has not seen "blood doctor "yet   Cholecystitis with cholelithiasis 09/26/2013   Diabetes mellitus without complication ([place])    Gallstones    GERD (gastroesophageal reflux disease)    Headache(784.0)    Hx of degenerative disc disease    Hypertension    Obesity    Thyroid disease     Past Surgical History:  Procedure Laterality Date   CHOLECYSTECTOMY N/A 09/26/2013   Procedure: LAPAROSCOPIC CHOLECYSTECTOMY WITH INTRAOPERATIVE CHOLANGIOGRAM;  Surgeon: Earnstine Regal, MD;  Location: [facility];  Service: General;  Laterality: N/A;    Family History  Problem Relation Age of Onset   Cancer Mother        Pancreatic Cancer    Social History:  reports that she has been smoking cigarettes. She has never used smokeless tobacco. She reports current alcohol use. She reports that she does not use drugs.  Allergies: No Known Allergies  No medications prior to admission.    Review of Systems  Constitutional: Negative.   Respiratory: Negative.    Cardiovascular: Negative.   Gastrointestinal: Negative.   Genitourinary:  Positive for menstrual problem.    There were no vitals taken for this visit. Physical Exam Constitutional:      Appearance: Normal appearance.  Cardiovascular:     Rate and Rhythm: Normal rate and regular rhythm.  Pulmonary:     Effort: Pulmonary effort is normal.     Breath sounds: Normal breath sounds.  Abdominal:     General: Bowel sounds are normal.     Palpations: Abdomen is soft.   Genitourinary:    Comments: Nl EGBUS, uterus < 10 weeks, mobile, non tender, no masses    No results found for this or any previous visit (from the past 24 hour(s)).  No results found.  Assessment/Plan: Heavy menstrual cycles Uterine fibroid  Pt desires definite therapy. TVH with possible salpingectomy reviewed with pt. R/B/Post op care reviewed with pt. Pt has verbalized understanding. Desires to proceed  Crystal Brewer 09/26/2021, 6:06 PM

## 2021-09-27 ENCOUNTER — Encounter (HOSPITAL_COMMUNITY)
Admission: RE | Admit: 2021-09-27 | Discharge: 2021-09-27 | Disposition: A | Discharge status: Home / Self Care | Payer: Medicaid Other | Source: Ambulatory Visit | Attending: Obstetrics and Gynecology | Admitting: Obstetrics and Gynecology

## 2021-09-27 ENCOUNTER — Encounter (HOSPITAL_COMMUNITY): Payer: Self-pay

## 2021-09-27 ENCOUNTER — Other Ambulatory Visit: Payer: Self-pay

## 2021-09-27 VITALS — BP 99/70 | HR 92 | Temp 98.4°F | Resp 18 | Ht 62.0 in | Wt 206.8 lb

## 2021-09-27 DIAGNOSIS — D219 Benign neoplasm of connective and other soft tissue, unspecified: Secondary | ICD-10-CM | POA: Diagnosis not present

## 2021-09-27 DIAGNOSIS — Z01818 Encounter for other preprocedural examination: Secondary | ICD-10-CM | POA: Diagnosis present

## 2021-09-27 LAB — BASIC METABOLIC PANEL
Anion gap: 8 (ref 5–15)
BUN: 14 mg/dL (ref 6–20)
CO2: 22 mmol/L (ref 22–32)
Calcium: 10.1 mg/dL (ref 8.9–10.3)
Chloride: 107 mmol/L (ref 98–111)
Creatinine, Ser: 0.79 mg/dL (ref 0.44–1.00)
GFR, Estimated: >60 mL/min (ref >60)
Glucose, Bld: 259 mg/dL — ABNORMAL HIGH (ref 70–99)
Potassium: 4.3 mmol/L (ref 3.5–5.1)
Sodium: 137 mmol/L (ref 135–145)

## 2021-09-27 LAB — CBC
HCT: 44.5 % (ref 36.0–46.0)
Hemoglobin: 14.6 g/dL (ref 12.0–15.0)
MCH: 31.3 pg (ref 26.0–34.0)
MCHC: 32.8 g/dL (ref 30.0–36.0)
MCV: 95.5 fL (ref 80.0–100.0)
Platelets: 307 10*3/uL (ref 150–400)
RBC: 4.66 MIL/uL (ref 3.87–5.11)
RDW: 14.9 % (ref 11.5–15.5)
WBC: 9.5 10*3/uL (ref 4.0–10.5)
nRBC: 0 % (ref 0.0–0.2)

## 2021-09-27 LAB — TYPE AND SCREEN
ABO/RH(D): O POS
Antibody Screen: NEGATIVE

## 2021-09-27 LAB — HEMOGLOBIN A1C
Hgb A1c MFr Bld: 10.5 % — ABNORMAL HIGH (ref 4.8–5.6)
Mean Plasma Glucose: 254.65 mg/dL

## 2021-09-27 LAB — GLUCOSE, CAPILLARY: Glucose-Capillary: 297 mg/dL — ABNORMAL HIGH (ref 70–99)

## 2021-09-27 NOTE — Pre-Procedure Instructions (Signed)
Surgical Instructions                 Your procedure is scheduled on Wednesday, September 6th.             Report to [name] Main Entrance "A" at 10:30 A.M., then check in with the Admitting office.             Call this number if you have problems the morning of surgery:             [phone]    If you have any questions prior to your surgery date call [phone]: Open Monday-Friday 8am-4pm                 Remember:             Do not eat after midnight the night before your surgery   You may drink clear liquids until 09:30 AM the morning of your surgery.   Clear liquids allowed are: Water, Non-Citrus Juices (without pulp), Carbonated Beverages, Clear Tea, Black Coffee Only (NO MILK, CREAM OR POWDERED CREAMER of any kind), and Gatorade. Patient Instructions  The night before surgery:  No food after midnight. ONLY clear liquids after midnight  The day of surgery (if you have diabetes): Drink ONE (1) 12 oz G2 given to you in your pre admission testing appointment by 9:30 the morning of surgery. Drink in one sitting. Do not sip.  This drink was given to you during your hospital  pre-op appointment visit.  Nothing else to drink after completing the  12 oz bottle of G2.         If you have questions, please contact your surgeon's office.                            Take these medicines the morning of surgery with A SIP OF WATER  cetirizine (ZYRTEC) fluconazole (DIFLUCAN)  gabapentin (NEURONTIN) megestrol (MEGACE)    If needed: albuterol (PROVENTIL HFA;VENTOLIN HFA)- if needed, bring with you on day of surgery fluticasone (FLONASE)  oxyCODONE-acetaminophen (PERCOCET) pantoprazole (PROTONIX)  sucralfate (CARAFATE)     WHAT DO I DO ABOUT MY DIABETES MEDICATION?     Do not take JARDIANCE for 72 hours prior to surgery. Do not take metFORMIN (GLUCOPHAGE) on the day of surgery.   Do not take TRULICITY for 1 week prior to surgery.         HOW TO MANAGE YOUR  DIABETES BEFORE AND AFTER SURGERY   Why is it important to control my blood sugar before and after surgery? Improving blood sugar levels before and after surgery helps healing and can limit problems. A way of improving blood sugar control is eating a healthy diet by:  Eating less sugar and carbohydrates  Increasing activity/exercise  Talking with your doctor about reaching your blood sugar goals High blood sugars (greater than 180 mg/dL) can raise your risk of infections and slow your recovery, so you will need to focus on controlling your diabetes during the weeks before surgery. Make sure that the doctor who takes care of your diabetes knows about your planned surgery including the date and location.   How do I manage my blood sugar before surgery? Check your blood sugar at least 4 times a day, starting 2 days before surgery, to make sure that the level is not too high or low.   Check your blood sugar the morning of your surgery when you  wake up and every 2 hours until you get to the Short Stay unit.   If your blood sugar is less than 70 mg/dL, you will need to treat for low blood sugar: Do not take insulin. Treat a low blood sugar (less than 70 mg/dL) with  cup of clear juice (cranberry or apple), 4 glucose tablets, OR glucose gel. Recheck blood sugar in 15 minutes after treatment (to make sure it is greater than 70 mg/dL). If your blood sugar is not greater than 70 mg/dL on recheck, call [phone] for further instructions. Report your blood sugar to the short stay nurse when you get to Short Stay.   If you are admitted to the hospital after surgery: Your blood sugar will be checked by the staff and you will probably be given insulin after surgery (instead of oral diabetes medicines) to make sure you have good blood sugar levels. The goal for blood sugar control after surgery is 80-180 mg/dL.    As of today, STOP taking any Aspirin (unless otherwise instructed by your surgeon)  Aleve, Naproxen, Ibuprofen, Motrin, Advil, Goody's, BC's, all herbal medications, fish oil, and all vitamins.                     Do NOT Smoke (Tobacco/Vaping) for 24 hours prior to your procedure.   If you use a CPAP at night, you may bring your mask/headgear for your overnight stay.   Contacts, glasses, piercing's, hearing aid's, dentures or partials may not be worn into surgery, please bring cases for these belongings.    For patients admitted to the hospital, discharge time will be determined by your treatment team.   Patients discharged the day of surgery will not be allowed to drive home, and someone needs to stay with them for 24 hours.   SURGICAL WAITING ROOM VISITATION Patients having surgery or a procedure may have no more than 2 support people in the waiting area - these visitors may rotate.   Children under the age of 72 must have an adult with them who is not the patient. If the patient needs to stay at the hospital during part of their recovery, the visitor guidelines for inpatient rooms apply. Pre-op nurse will coordinate an appropriate time for 1 support person to accompany patient in pre-op.  This support person may not rotate.    Please refer to the [facility] website for the visitor guidelines for Inpatients (after your surgery is over and you are in a regular room).      Special instructions:   [place]- Preparing For Surgery   Before surgery, you can play an important role. Because skin is not sterile, your skin needs to be as free of germs as possible. You can reduce the number of germs on your skin by washing with CHG (chlorahexidine gluconate) Soap before surgery.  CHG is an antiseptic cleaner which kills germs and bonds with the skin to continue killing germs even after washing.     Oral Hygiene is also important to reduce your risk of infection.  Remember - BRUSH YOUR TEETH THE MORNING OF SURGERY WITH YOUR REGULAR TOOTHPASTE   Please do not use if you have an  allergy to CHG or antibacterial soaps. If your skin becomes reddened/irritated stop using the CHG.  Do not shave (including legs and underarms) for at least 48 hours prior to first CHG shower. It is OK to shave your face.   Please follow these instructions carefully.  Shower the NIGHT BEFORE SURGERY and the MORNING OF SURGERY   If you chose to wash your hair, wash your hair first as usual with your normal shampoo.   After you shampoo, rinse your hair and body thoroughly to remove the shampoo.   Use CHG Soap as you would any other liquid soap. You can apply CHG directly to the skin and wash gently with a scrungie or a clean washcloth.    Apply the CHG Soap to your body ONLY FROM THE NECK DOWN.  Do not use on open wounds or open sores. Avoid contact with your eyes, ears, mouth and genitals (private parts). Wash Face and genitals (private parts)  with your normal soap.    Wash thoroughly, paying special attention to the area where your surgery will be performed.   Thoroughly rinse your body with warm water from the neck down.   DO NOT shower/wash with your normal soap after using and rinsing off the CHG Soap.   Pat yourself dry with a CLEAN TOWEL.   Wear CLEAN PAJAMAS to bed the night before surgery   Place CLEAN SHEETS on your bed the night before your surgery   DO NOT SLEEP WITH PETS.     Day of Surgery: Take a shower with CHG soap. Do not wear jewelry or makeup Do not wear lotions, powders, perfumes, or deodorant. Do not shave 48 hours prior to surgery.   Do not bring valuables to the hospital.  [facility] is not responsible for any belongings or valuables. Do not wear nail polish, gel polish, artificial nails, or any other type of covering on natural nails (fingers and toes) If you have artificial nails or gel coating that need to be removed by a nail  salon, please have this removed prior to surgery. Artificial nails or gel coating may interfere with anesthesia's ability to adequately monitor your vital signs. Wear Clean/Comfortable clothing the morning of surgery Remember to brush your teeth WITH YOUR REGULAR TOOTHPASTE.   Please read over the following fact sheets that you were given.       If you received a COVID test during your pre-op visit  it is requested that you wear a mask when out in public, stay away from anyone that may not be feeling well and notify your surgeon if you develop symptoms. If you have been in contact with anyone that has tested positive in the last 10 days please notify you surgeon.

## 2021-09-27 NOTE — Progress Notes (Signed)
PCP - Nolene Ebbs MD Cardiologist - denies  PPM/ICD - denies  Chest x-ray - 2015 EKG - pending Stress Test - denies ECHO - denies Cardiac Cath - denies  Sleep Study - denies  Fasting Blood Sugar - doesn't do fasting sugars at home; sees 180-220 at doctors office Checks Blood Sugar monthly  As of today, STOP taking any Aspirin (unless otherwise instructed by your surgeon) Aleve, Naproxen, Ibuprofen, Motrin, Advil, Goody's, BC's, all herbal medications, fish oil, and all vitamins.  ERAS Protcol - yes PRE-SURGERY Ensure or G2- G2  COVID TEST- n/a   Anesthesia review: no  Patient denies shortness of breath, fever, cough and chest pain at PAT appointment   All instructions explained to the patient, with a verbal understanding of the material. Patient agrees to go over the instructions while at home for a better understanding. Patient also instructed to self quarantine after being tested for COVID-19. The opportunity to ask questions was provided.

## 2021-09-28 ENCOUNTER — Encounter (HOSPITAL_COMMUNITY): Payer: Self-pay | Admitting: Physician Assistant

## 2021-09-28 ENCOUNTER — Telehealth: Payer: Self-pay | Admitting: Obstetrics and Gynecology

## 2021-09-28 NOTE — Telephone Encounter (Signed)
Spoke to pt in regards to her pre op labs. DM is uncontrolled. Pt was made aware of the need to cancel surgery due to uncontrolled DM. Note to PCP for further evaluation and pre op clearance. [place] notified

## 2021-10-05 ENCOUNTER — Encounter (HOSPITAL_COMMUNITY): Admission: RE | Payer: Self-pay | Source: Home / Self Care

## 2021-10-05 ENCOUNTER — Ambulatory Visit (HOSPITAL_COMMUNITY)
Admission: RE | Admit: 2021-10-05 | Payer: Medicaid Other | Source: Home / Self Care | Admitting: Obstetrics and Gynecology

## 2021-10-05 DIAGNOSIS — D219 Benign neoplasm of connective and other soft tissue, unspecified: Secondary | ICD-10-CM

## 2021-10-05 SURGERY — HYSTERECTOMY, VAGINAL
Anesthesia: Choice | Laterality: Bilateral

## 2021-11-18 ENCOUNTER — Ambulatory Visit (HOSPITAL_COMMUNITY)
Admission: EM | Admit: 2021-11-18 | Discharge: 2021-11-18 | Disposition: A | Discharge status: Home / Self Care | Payer: Medicaid Other | Attending: Internal Medicine | Admitting: Internal Medicine

## 2021-11-18 ENCOUNTER — Encounter (HOSPITAL_COMMUNITY): Payer: Self-pay

## 2021-11-18 DIAGNOSIS — N898 Other specified noninflammatory disorders of vagina: Secondary | ICD-10-CM | POA: Diagnosis not present

## 2021-11-18 DIAGNOSIS — N939 Abnormal uterine and vaginal bleeding, unspecified: Secondary | ICD-10-CM | POA: Insufficient documentation

## 2021-11-18 DIAGNOSIS — R1012 Left upper quadrant pain: Secondary | ICD-10-CM | POA: Diagnosis not present

## 2021-11-18 LAB — COMPREHENSIVE METABOLIC PANEL
ALT: 21 U/L (ref 0–44)
AST: 17 U/L (ref 15–41)
Albumin: 4.1 g/dL (ref 3.5–5.0)
Alkaline Phosphatase: 85 U/L (ref 38–126)
Anion gap: 18 — ABNORMAL HIGH (ref 5–15)
BUN: 17 mg/dL (ref 6–20)
CO2: 21 mmol/L — ABNORMAL LOW (ref 22–32)
Calcium: 10.3 mg/dL (ref 8.9–10.3)
Chloride: 97 mmol/L — ABNORMAL LOW (ref 98–111)
Creatinine, Ser: 0.77 mg/dL (ref 0.44–1.00)
GFR, Estimated: >60 mL/min (ref >60)
Glucose, Bld: 244 mg/dL — ABNORMAL HIGH (ref 70–99)
Potassium: 3.8 mmol/L (ref 3.5–5.1)
Sodium: 136 mmol/L (ref 135–145)
Total Bilirubin: 0.5 mg/dL (ref 0.3–1.2)
Total Protein: 7.7 g/dL (ref 6.5–8.1)

## 2021-11-18 LAB — CBC WITH DIFFERENTIAL/PLATELET
Abs Immature Granulocytes: 0.09 10*3/uL — ABNORMAL HIGH (ref 0.00–0.07)
Basophils Absolute: 0.1 10*3/uL (ref 0.0–0.1)
Basophils Relative: 1 %
Eosinophils Absolute: 0.2 10*3/uL (ref 0.0–0.5)
Eosinophils Relative: 2 %
HCT: 44 % (ref 36.0–46.0)
Hemoglobin: 14.8 g/dL (ref 12.0–15.0)
Immature Granulocytes: 1 %
Lymphocytes Relative: 27 %
Lymphs Abs: 3.1 10*3/uL (ref 0.7–4.0)
MCH: 31.4 pg (ref 26.0–34.0)
MCHC: 33.6 g/dL (ref 30.0–36.0)
MCV: 93.2 fL (ref 80.0–100.0)
Monocytes Absolute: 0.5 10*3/uL (ref 0.1–1.0)
Monocytes Relative: 4 %
Neutro Abs: 7.5 10*3/uL (ref 1.7–7.7)
Neutrophils Relative %: 65 %
Platelets: 319 10*3/uL (ref 150–400)
RBC: 4.72 MIL/uL (ref 3.87–5.11)
RDW: 14.6 % (ref 11.5–15.5)
WBC: 11.4 10*3/uL — ABNORMAL HIGH (ref 4.0–10.5)
nRBC: 0 % (ref 0.0–0.2)

## 2021-11-18 LAB — POCT URINALYSIS DIPSTICK, ED / UC
Bilirubin Urine: NEGATIVE
Glucose, UA: >=1000 mg/dL — AB
Ketones, ur: NEGATIVE mg/dL
Leukocytes,Ua: NEGATIVE
Nitrite: NEGATIVE
Protein, ur: NEGATIVE mg/dL
Specific Gravity, Urine: 1.01 (ref 1.005–1.030)
Urobilinogen, UA: 0.2 mg/dL (ref 0.0–1.0)
pH: 5.5 (ref 5.0–8.0)

## 2021-11-18 LAB — POC URINE PREG, ED: Preg Test, Ur: NEGATIVE

## 2021-11-18 LAB — LIPASE, BLOOD: Lipase: 46 U/L (ref 11–51)

## 2021-11-18 MED ORDER — FLUCONAZOLE 150 MG PO TABS: 150.0000 mg | ORAL_TABLET | Freq: Every day | ORAL | 0 refills | Status: AC

## 2021-11-18 NOTE — ED Triage Notes (Signed)
Pt states passing blood clots when she urinates and some spotting. States she was suppose to have a hysterectomy last month but was cancelled d/t A1C was high. States takes megace '40mg'$  once a day with no relief of bleeding. C/o upper abdominal pain/pressure. Denies burning or difficulty urinating.

## 2021-11-18 NOTE — ED Provider Notes (Addendum)
[facility]    CSN: [id number] Arrival date & time: 11/18/21  3419      History   Chief Complaint Chief Complaint  Patient presents with   Vaginal Bleeding    HPI Crystal Brewer is a 46 y.o. female.  Patient complaining of irregular bleeding with urination that has been ongoing for the past month.  Patient reports taking Megace for vaginal bleeding was prescribed by her GYN provider.  Patient reports she was supposed to have a hysterectomy but due to her A1c levels the doctors and not able to do that at this time.  Patient denies any fevers, chills, or burning with urination.  Patient reports that Megace has control her irregular bleeding, she currently only sees blood after urination when wiping.  Patient dose report still seeing blood on panty liner daily, but not a significant amount per patient statement. Patient reports left upper quadrant abdominal pain that is more noticeable with urination. Patient reports history of gallbladder removal.   Patient reports vaginal itching x 1 month. Patient denies any known STD exposure.  Patient denies any current sexual activity.  Patient reports having repeat yeast infections due to diabetes mellitus.  Patient requesting medication to treat yeast infection.    Vaginal Bleeding Associated symptoms: vaginal discharge   Associated symptoms: no abdominal pain (LUQ, when urinating at times), no dyspareunia, no dysuria, no fatigue, no fever and no nausea     Past Medical History:  Diagnosis Date   Anemia    Asthma    Bleeding disorder ([place])    "UNKNOWN PROBLEM" has not seen "blood doctor "yet   Cholecystitis with cholelithiasis 09/26/2013   Diabetes mellitus without complication ([place])    Gallstones    GERD (gastroesophageal reflux disease)    Headache(784.0)    Hx of degenerative disc disease    Hypertension    Obesity    Thyroid disease     Patient Active Problem List   Diagnosis Date Noted   Visit for routine gyn exam  05/18/2021   Fibroids 05/18/2021   Possible exposure to STD 05/18/2021   Enlarged thyroid 05/18/2021    Past Surgical History:  Procedure Laterality Date   CHOLECYSTECTOMY N/A 09/26/2013   Procedure: LAPAROSCOPIC CHOLECYSTECTOMY WITH INTRAOPERATIVE CHOLANGIOGRAM;  Surgeon: Earnstine Regal, MD;  Location: [facility];  Service: General;  Laterality: N/A;    OB History     Gravida  10   Para  7   Term  7   Preterm  0   AB  3   Living  7      SAB  3   IAB  0   Ectopic  0   Multiple  0   Live Births  7            Home Medications    Prior to Admission medications   Medication Sig Start Date End Date Taking? Authorizing Provider  fluconazole (DIFLUCAN) 150 MG tablet Take 1 tablet (150 mg total) by mouth daily. Take 1 tablet today and 1 tablet on day 3 after starting the initial pill 11/18/21  Yes Sakeena Teall, Fidela Juneau, NP  albuterol (PROVENTIL HFA;VENTOLIN HFA) 108 (90 BASE) MCG/ACT inhaler Inhale 2 puffs into the lungs every 4 (four) hours as needed for wheezing or shortness of breath.    [provider]  cetirizine (ZYRTEC) 10 MG tablet Take 10 mg by mouth every 7 (seven) days. 09/02/21   [provider]  fluticasone (FLONASE) 50 MCG/ACT nasal spray Place  2 sprays into both nostrils daily as needed for allergies or rhinitis.    [provider]  gabapentin (NEURONTIN) 300 MG capsule TAKE 1 (ONE) CAPSULE BY MOUTH THREE TIMES DAILY Patient taking differently: Take 300 mg by mouth 2 (two) times daily. 11/26/19 09/20/21  Cullop, Nena Alexander, NP  JARDIANCE 25 MG TABS tablet Take 25 mg by mouth daily. 09/02/21   [provider]  lisinopril-hydrochlorothiazide (PRINZIDE,ZESTORETIC) 20-12.5 MG per tablet Take 1 tablet by mouth every morning.     [provider]  megestrol (MEGACE) 40 MG tablet Take 1 tablet (40 mg total) by mouth daily. Can increase to two tablets twice a day in the event of heavy bleeding 09/14/21   Chancy Milroy, MD   metFORMIN (GLUCOPHAGE) 1000 MG tablet Take 1,000 mg by mouth 2 (two) times daily with a meal.    [provider]  naproxen (NAPROSYN) 500 MG tablet Take 1 tablet (500 mg total) by mouth 2 (two) times daily with a meal. Take during periods to reduce bleeding or take for pain as needed. Patient not taking: Reported on 09/20/2021 06/15/21   Osborne Oman, MD  oxyCODONE-acetaminophen (PERCOCET) 10-325 MG tablet Take 1 tablet by mouth 5 (five) times daily as needed for pain. 09/03/21   [provider]  pantoprazole (PROTONIX) 40 MG tablet Take 40 mg by mouth daily as needed (acid reflux). 09/02/21   [provider]  sucralfate (CARAFATE) 1 g tablet Take 1 g by mouth daily as needed (acid reflux). 09/02/21   [provider]  TRULICITY 4.5 EP/3.2RJ SOPN Inject 4.5 mg into the skin every 7 (seven) days. 09/16/21   [provider]    Family History Family History  Problem Relation Age of Onset   Cancer Mother        Pancreatic Cancer    Social History Social History   Tobacco Use   Smoking status: Every Day    Packs/day: 0.50    Years: 25.00    Total pack years: 12.50    Types: Cigarettes   Smokeless tobacco: Never  Vaping Use   Vaping Use: Some days   Substances: Nicotine, Flavoring  Substance Use Topics   Alcohol use: Not Currently   Drug use: No     Allergies   Patient has no known allergies.   Review of Systems Review of Systems  Constitutional:  Negative for activity change, fatigue and fever.  Respiratory: Negative.    Cardiovascular: Negative.   Gastrointestinal:  Negative for abdominal pain (LUQ, when urinating at times), constipation, diarrhea, nausea and vomiting.  Endocrine: Negative.   Genitourinary:  Positive for hematuria, menstrual problem (Irregulat bleeding ongoing for the past few months), vaginal bleeding and vaginal discharge. Negative for decreased urine volume, difficulty urinating, dyspareunia, dysuria, flank pain,  frequency, pelvic pain, urgency and vaginal pain.     Physical Exam Triage Vital Signs ED Triage Vitals  Enc Vitals Group     BP 11/18/21 0916 122/71     Pulse Rate 11/18/21 0916 (!) 105     Resp 11/18/21 0916 18     Temp 11/18/21 0916 99.1 F (37.3 C)     Temp Source 11/18/21 0916 Oral     SpO2 11/18/21 0916 100 %     Weight --      Height --      Head Circumference --      Peak Flow --      Pain Score 11/18/21 0917 0  Pain Loc --      Pain Edu? --      Excl. in Power? --    No data found.  Updated Vital Signs BP 122/71 (BP Location: Left Arm)   Pulse (!) 105   Temp 99.1 F (37.3 C) (Oral)   Resp 18   SpO2 100%      Physical Exam Vitals and nursing note reviewed.  Constitutional:      Appearance: Normal appearance.  Cardiovascular:     Rate and Rhythm: Normal rate and regular rhythm.  Pulmonary:     Effort: Pulmonary effort is normal.     Breath sounds: Normal breath sounds and air entry.  Abdominal:     General: Bowel sounds are normal. There is no distension.     Palpations: Abdomen is soft. There is no shifting dullness, hepatomegaly, splenomegaly or mass.     Tenderness: There is abdominal tenderness in the left upper quadrant. There is no right CVA tenderness, left CVA tenderness, guarding or rebound. Negative signs include Murphy's sign and McBurney's sign.  Neurological:     Mental Status: She is alert.      UC Treatments / Results  Labs (all labs ordered are listed, but only abnormal results are displayed) Labs Reviewed  POCT URINALYSIS DIPSTICK, ED / UC - Abnormal; Notable for the following components:      Result Value   Glucose, UA >=1000 (*)    Hgb urine dipstick LARGE (*)    All other components within normal limits  URINE CULTURE  COMPREHENSIVE METABOLIC PANEL  CBC WITH DIFFERENTIAL/PLATELET  LIPASE, BLOOD  POC URINE PREG, ED    EKG   Radiology No results found.  Procedures Procedures (including critical care  time)  Medications Ordered in UC Medications - No data to display  Initial Impression / Assessment and Plan / UC Course  I have reviewed the triage vital signs and the nursing notes.  Pertinent labs & imaging results that were available during my care of the patient were reviewed by me and considered in my medical decision making (see chart for details).     Patient was evaluated for ongoing vaginal bleeding, vaginal itching and hematuria.   Patient's hematuria may be associated with the ongoing vaginal bleeding.  Patient reports having blood with wiping and intermittent blood at times on her panty liner.  Patient continues to take Megace prescribed by her GYN provider.  Patient was advised to continue taking Megace and schedule follow-up appointment today with GYN provider since they were unable to perform a hysterectomy due to poor glycemic control.  CBC collected in office verify hemoglobin levels are stable.  Urinalysis performed in office found large blood, this may be associated with ongoing no bleeding.  Urine culture sent to confirm that there's no bacteria in urine.   Patient reported vaginal itching associated with for glycemic control due to diabetes.  Patient appears low risk for any potential STD exposure.  Diflucan was sent to the pharmacy to treat potential yeast infection.   Intermittent left upper quadrant abdominal pain is nonspecific to the patients other complaints today. Patient states pain is more apparent with urination. CBC w/ diff, CMP, and Lipase collected due to patients non specific symptoms to verify normal kidney, liver, pancreatic, and WBC count. Patient was made aware of results reporting protocol. Patient was made aware if severe ABD pain occurs she will need to go to Emergency Department. Patient verbalized understanding of instructions.   Patient was educated on  maintaining normal glycemic levels.  She was encouraged to continue taking her diabetes medications  prescribed by her PCP.  Patient states she has a PCP follow-up in November on A1c.  Patient was educated on drinking 8 cups of water daily.  She was encouraged to check her blood sugars at least 1 time daily.  Patient verbalized understanding of instructions.  Urinalysis showed over 1000 glucose, this is associated with the patient's use of SGLT2 medication.   Final Clinical Impressions(s) / UC Diagnoses   Final diagnoses:  Vaginal bleeding  Vaginal discharge  Left upper quadrant abdominal pain     Discharge Instructions      I have sent Diflucan to the pharmacy to help with chronic yeast infections.  Urine culture has been sent, if the test results are positive we will give you a call.  We also performed some blood work, if any of these test results warrant a change in your plan of care we will give you a call.  You can view these results on MyChart.   Please schedule an appointment with your gynecologist to follow-up on this ongoing issue with vaginal bleeding.  I have attached his information to your paperwork.   As discussed, please drink 8 cups of water daily, this will also help with your glucose control.  I think it would be great for you to check your blood sugars at least once a day home.      ED Prescriptions     Medication Sig Dispense Auth. Provider   fluconazole (DIFLUCAN) 150 MG tablet Take 1 tablet (150 mg total) by mouth daily. Take 1 tablet today and 1 tablet on day 3 after starting the initial pill 2 tablet Flossie Dibble, NP      PDMP not reviewed this encounter.   Flossie Dibble, NP 11/18/21 1119    Flossie Dibble, NP 11/18/21 1121

## 2021-11-18 NOTE — Discharge Instructions (Addendum)
I have sent Diflucan to the pharmacy to help with chronic yeast infections.  Urine culture has been sent, if the test results are positive we will give you a call.  We also performed some blood work, if any of these test results warrant a change in your plan of care we will give you a call.  You can view these results on MyChart.   Please schedule an appointment with your gynecologist to follow-up on this ongoing issue with vaginal bleeding.  I have attached his information to your paperwork.   As discussed, please drink 8 cups of water daily, this will also help with your glucose control.  I think it would be great for you to check your blood sugars at least once a day home.

## 2021-11-19 LAB — URINE CULTURE
Culture: <10000 — AB
Special Requests: NORMAL

## 2021-12-12 ENCOUNTER — Other Ambulatory Visit: Payer: Self-pay | Admitting: Internal Medicine

## 2021-12-13 LAB — COMPLETE METABOLIC PANEL WITH GFR
AG Ratio: 1.4 (calc) (ref 1.0–2.5)
ALT: 13 U/L (ref 6–29)
AST: 12 U/L (ref 10–35)
Albumin: 4.2 g/dL (ref 3.6–5.1)
Alkaline phosphatase (APISO): 105 U/L (ref 31–125)
BUN: 15 mg/dL (ref 7–25)
CO2: 18 mmol/L — ABNORMAL LOW (ref 20–32)
Calcium: 9.8 mg/dL (ref 8.6–10.2)
Chloride: 100 mmol/L (ref 98–110)
Creat: 0.75 mg/dL (ref 0.50–0.99)
Globulin: 3.1 g/dL (calc) (ref 1.9–3.7)
Glucose, Bld: 325 mg/dL — ABNORMAL HIGH (ref 65–99)
Potassium: 4.2 mmol/L (ref 3.5–5.3)
Sodium: 133 mmol/L — ABNORMAL LOW (ref 135–146)
Total Bilirubin: 0.4 mg/dL (ref 0.2–1.2)
Total Protein: 7.3 g/dL (ref 6.1–8.1)
eGFR: 99 mL/min/{1.73_m2} (ref >=60)

## 2021-12-13 LAB — CBC
HCT: 42.3 % (ref 35.0–45.0)
Hemoglobin: 14.3 g/dL (ref 11.7–15.5)
MCH: 31.9 pg (ref 27.0–33.0)
MCHC: 33.8 g/dL (ref 32.0–36.0)
MCV: 94.4 fL (ref 80.0–100.0)
MPV: 10.2 fL (ref 7.5–12.5)
Platelets: 343 10*3/uL (ref 140–400)
RBC: 4.48 10*6/uL (ref 3.80–5.10)
RDW: 13.1 % (ref 11.0–15.0)
WBC: 10.2 10*3/uL (ref 3.8–10.8)

## 2021-12-13 LAB — TSH: TSH: 0.65 mIU/L

## 2021-12-13 LAB — VITAMIN D 25 HYDROXY (VIT D DEFICIENCY, FRACTURES): Vit D, 25-Hydroxy: 32 ng/mL (ref 30–100)

## 2021-12-30 ENCOUNTER — Other Ambulatory Visit: Payer: Self-pay | Admitting: Obstetrics and Gynecology

## 2021-12-30 DIAGNOSIS — D219 Benign neoplasm of connective and other soft tissue, unspecified: Secondary | ICD-10-CM

## 2022-01-27 ENCOUNTER — Other Ambulatory Visit: Payer: Self-pay | Admitting: Obstetrics and Gynecology

## 2022-01-27 DIAGNOSIS — D219 Benign neoplasm of connective and other soft tissue, unspecified: Secondary | ICD-10-CM

## 2022-02-10 ENCOUNTER — Other Ambulatory Visit: Payer: Self-pay | Admitting: Obstetrics

## 2022-02-10 DIAGNOSIS — D219 Benign neoplasm of connective and other soft tissue, unspecified: Secondary | ICD-10-CM

## 2022-03-25 ENCOUNTER — Other Ambulatory Visit: Payer: Self-pay | Admitting: Obstetrics

## 2022-03-25 DIAGNOSIS — D219 Benign neoplasm of connective and other soft tissue, unspecified: Secondary | ICD-10-CM

## 2022-04-21 ENCOUNTER — Other Ambulatory Visit: Payer: Self-pay | Admitting: Obstetrics

## 2022-04-21 DIAGNOSIS — D219 Benign neoplasm of connective and other soft tissue, unspecified: Secondary | ICD-10-CM

## 2022-05-04 ENCOUNTER — Other Ambulatory Visit: Payer: Self-pay | Admitting: Obstetrics

## 2022-05-04 DIAGNOSIS — D219 Benign neoplasm of connective and other soft tissue, unspecified: Secondary | ICD-10-CM

## 2022-05-10 ENCOUNTER — Other Ambulatory Visit (HOSPITAL_BASED_OUTPATIENT_CLINIC_OR_DEPARTMENT_OTHER): Payer: Self-pay

## 2022-05-10 MED ORDER — TRULICITY 4.5 MG/0.5ML ~~LOC~~ SOAJ
4.5000 mg | SUBCUTANEOUS | 5 refills | Status: AC
  Filled 2022-05-10: qty 2, 28d supply, fill #0
  Filled 2022-05-31: qty 2, 28d supply, fill #1
  Filled 2022-06-29: qty 2, 28d supply, fill #2

## 2022-05-31 ENCOUNTER — Other Ambulatory Visit (HOSPITAL_BASED_OUTPATIENT_CLINIC_OR_DEPARTMENT_OTHER): Payer: Self-pay

## 2022-06-29 ENCOUNTER — Other Ambulatory Visit (HOSPITAL_BASED_OUTPATIENT_CLINIC_OR_DEPARTMENT_OTHER): Payer: Self-pay

## 2022-07-08 ENCOUNTER — Other Ambulatory Visit (HOSPITAL_BASED_OUTPATIENT_CLINIC_OR_DEPARTMENT_OTHER): Payer: Self-pay

## 2022-07-12 ENCOUNTER — Other Ambulatory Visit (HOSPITAL_BASED_OUTPATIENT_CLINIC_OR_DEPARTMENT_OTHER): Payer: Self-pay

## 2022-08-01 ENCOUNTER — Other Ambulatory Visit: Payer: Self-pay | Admitting: Obstetrics

## 2022-08-01 DIAGNOSIS — D219 Benign neoplasm of connective and other soft tissue, unspecified: Secondary | ICD-10-CM

## 2022-08-15 ENCOUNTER — Other Ambulatory Visit: Payer: Self-pay | Admitting: Obstetrics and Gynecology

## 2022-08-15 DIAGNOSIS — D219 Benign neoplasm of connective and other soft tissue, unspecified: Secondary | ICD-10-CM

## 2022-09-12 ENCOUNTER — Other Ambulatory Visit: Payer: Self-pay | Admitting: Family Medicine

## 2022-09-12 DIAGNOSIS — D219 Benign neoplasm of connective and other soft tissue, unspecified: Secondary | ICD-10-CM

## 2022-10-13 ENCOUNTER — Other Ambulatory Visit: Payer: Self-pay | Admitting: Family Medicine

## 2022-10-13 DIAGNOSIS — D219 Benign neoplasm of connective and other soft tissue, unspecified: Secondary | ICD-10-CM

## 2022-11-14 ENCOUNTER — Other Ambulatory Visit: Payer: Self-pay | Admitting: Family Medicine

## 2022-11-14 DIAGNOSIS — D219 Benign neoplasm of connective and other soft tissue, unspecified: Secondary | ICD-10-CM

## 2022-12-15 ENCOUNTER — Other Ambulatory Visit: Payer: Self-pay | Admitting: Family Medicine

## 2022-12-15 DIAGNOSIS — D219 Benign neoplasm of connective and other soft tissue, unspecified: Secondary | ICD-10-CM

## 2024-01-17 ENCOUNTER — Other Ambulatory Visit: Payer: Self-pay | Admitting: Nurse Practitioner

## 2024-01-17 DIAGNOSIS — Z1231 Encounter for screening mammogram for malignant neoplasm of breast: Secondary | ICD-10-CM

## 2024-02-14 ENCOUNTER — Ambulatory Visit

## 2024-02-14 ENCOUNTER — Ambulatory Visit
Admission: RE | Admit: 2024-02-14 | Discharge: 2024-02-14 | Disposition: A | Source: Ambulatory Visit | Attending: Nurse Practitioner | Admitting: Nurse Practitioner

## 2024-02-14 DIAGNOSIS — Z1231 Encounter for screening mammogram for malignant neoplasm of breast: Secondary | ICD-10-CM

## 2024-02-29 ENCOUNTER — Ambulatory Visit (INDEPENDENT_AMBULATORY_CARE_PROVIDER_SITE_OTHER): Admitting: Podiatry

## 2024-02-29 DIAGNOSIS — M216X2 Other acquired deformities of left foot: Secondary | ICD-10-CM

## 2024-02-29 DIAGNOSIS — M216X1 Other acquired deformities of right foot: Secondary | ICD-10-CM

## 2024-02-29 DIAGNOSIS — D492 Neoplasm of unspecified behavior of bone, soft tissue, and skin: Secondary | ICD-10-CM | POA: Diagnosis not present

## 2024-02-29 NOTE — Progress Notes (Signed)
 "  Subjective:  Patient ID: Crystal Brewer, female    DOB: 1975-02-15,  MRN: [id number]  Chief Complaint  Patient presents with   Foot Pain    Pt stated that she has this place below her great toe that causes her some discomfort when she walks     49 y.o. female presents with the above complaint.  Patient presents with complaint of bilateral submetatarsal 1 porokeratotic lesion/callus formation.  She is a diabetic with uncontrolled A1c of 8.9.  She wanted to discuss treatment options for has not seen and was prior to seeing me.  She does not want new orthotics.  Pain scale is 7 out of 10 dull aching nature.   Review of Systems: Negative except as noted in the HPI. Denies N/V/F/Ch.  Past Medical History:  Diagnosis Date   Anemia    Asthma    Bleeding disorder    UNKNOWN PROBLEM has not seen blood doctor yet   Cholecystitis with cholelithiasis 09/26/2013   Diabetes mellitus without complication (HCC)    Gallstones    GERD (gastroesophageal reflux disease)    Headache(784.0)    Hx of degenerative disc disease    Hypertension    Obesity    Thyroid  disease    Current Medications[1]  Tobacco Use History[2]  Allergies[3] Objective:  There were no vitals filed for this visit. There is no height or weight on file to calculate BMI. Constitutional Well developed. Well nourished.  Vascular Dorsalis pedis pulses palpable bilaterally. Posterior tibial pulses palpable bilaterally. Capillary refill normal to all digits.  No cyanosis or clubbing noted. Pedal hair growth normal.  Neurologic Normal speech. Oriented to person, place, and time. Epicritic sensation to light touch grossly present bilaterally.  Dermatologic Left submetatarsal 1 porokeratotic lesion with central nucleated core painful to touch no ulceration or lesion noted.  Pes planovalgus orthotics noted  Orthopedic: Pes planovalgus foot structure noted with calcaneovalgus to many toe signs partially recurred the arch or  dorsiflexion of the hallux unable to perform single double heel raise.   Radiographs: None Assessment:  No diagnosis found. Plan:  Patient was evaluated and treated and all questions answered.    Left submetatarsal 1 porokeratosis - All questions and concerns were discussed with the patient in extensive detail given the presence of porokeratotic lesion patient would benefit from debridement of the lesion using chisel to handle the lesion was debride down to healthy dry tissue no complication noted no pinpoint bleeding noted shoe gear modification discussed orthotics discussed  Pes planovalgus/foot deformity -I explained to patient the etiology of pes planovalgus and relationship with heel pain/arch pain and various treatment options were discussed.  Given patient foot structure in the setting of heel pain/arch pain I believe patient will benefit from custom-made orthotics to help control the hindfoot motion support the arch of the foot and take the stress away from arches.  Patient agrees with the plan like to proceed with orthotics -Patient was casted for orthotics   No follow-ups on file.    [1]  Current Outpatient Medications:    albuterol  (PROVENTIL  HFA;VENTOLIN  HFA) 108 (90 BASE) MCG/ACT inhaler, Inhale 2 puffs into the lungs every 4 (four) hours as needed for wheezing or shortness of breath., Disp: , Rfl:    cetirizine  (ZYRTEC ) 10 MG tablet, Take 10 mg by mouth every 7 (seven) days., Disp: , Rfl:    Dulaglutide  (TRULICITY ) 4.5 MG/0.5ML SOPN, Take 4.5 mg by mouth once a week., Disp: 2 mL, Rfl: 5   fluconazole  (  DIFLUCAN ) 150 MG tablet, Take 1 tablet (150 mg total) by mouth daily. Take 1 tablet today and 1 tablet on day 3 after starting the initial pill, Disp: 2 tablet, Rfl: 0   fluticasone (FLONASE) 50 MCG/ACT nasal spray, Place 2 sprays into both nostrils daily as needed for allergies or rhinitis., Disp: , Rfl:    gabapentin (NEURONTIN) 300 MG capsule, TAKE 1 (ONE) CAPSULE BY MOUTH  THREE TIMES DAILY (Patient taking differently: Take 300 mg by mouth 2 (two) times daily.), Disp: 90 capsule, Rfl: 0   JARDIANCE 25 MG TABS tablet, Take 25 mg by mouth daily., Disp: , Rfl:    lisinopril -hydrochlorothiazide  (PRINZIDE ,ZESTORETIC ) 20-12.5 MG per tablet, Take 1 tablet by mouth every morning. , Disp: , Rfl:    megestrol  (MEGACE ) 40 MG tablet, TAKE ONE TABLET BY MOUTH EVERY DAY ** MAY increase TO TWO tablets TWICE DAILY, Disp: 30 tablet, Rfl: 0   metFORMIN  (GLUCOPHAGE ) 1000 MG tablet, Take 1,000 mg by mouth 2 (two) times daily with a meal., Disp: , Rfl:    naproxen  (NAPROSYN ) 500 MG tablet, Take 1 tablet (500 mg total) by mouth 2 (two) times daily with a meal. Take during periods to reduce bleeding or take for pain as needed. (Patient not taking: Reported on 09/20/2021), Disp: 30 tablet, Rfl: 2   oxyCODONE -acetaminophen  (PERCOCET) 10-325 MG tablet, Take 1 tablet by mouth 5 (five) times daily as needed for pain., Disp: , Rfl:    pantoprazole (PROTONIX) 40 MG tablet, Take 40 mg by mouth daily as needed (acid reflux)., Disp: , Rfl:    sucralfate (CARAFATE) 1 g tablet, Take 1 g by mouth daily as needed (acid reflux)., Disp: , Rfl:    TRULICITY  4.5 MG/0.5ML SOPN, Inject 4.5 mg into the skin every 7 (seven) days., Disp: , Rfl:  [2]  Social History Tobacco Use  Smoking Status Every Day   Current packs/day: 0.50   Average packs/day: 0.5 packs/day for 25.0 years (12.5 ttl pk-yrs)   Types: Cigarettes  Smokeless Tobacco Never  [3] No Known Allergies  "
